# Patient Record
Sex: Female | Born: 1975 | Race: Black or African American | Hispanic: No | Marital: Single | Smoking: Never smoker
Health system: Southern US, Community
[De-identification: ages and names within clinical notes are randomized; demographics above are authoritative.]

## PROBLEM LIST (undated history)

## (undated) DIAGNOSIS — I10 Essential (primary) hypertension: Secondary | ICD-10-CM

---

## 2013-07-25 DIAGNOSIS — I10 Essential (primary) hypertension: Secondary | ICD-10-CM | POA: Insufficient documentation

## 2017-12-06 DIAGNOSIS — Z8742 Personal history of other diseases of the female genital tract: Secondary | ICD-10-CM | POA: Insufficient documentation

## 2018-04-24 DIAGNOSIS — Z9289 Personal history of other medical treatment: Secondary | ICD-10-CM | POA: Insufficient documentation

## 2018-04-25 DIAGNOSIS — G43009 Migraine without aura, not intractable, without status migrainosus: Secondary | ICD-10-CM | POA: Insufficient documentation

## 2018-11-22 DIAGNOSIS — M26609 Unspecified temporomandibular joint disorder, unspecified side: Secondary | ICD-10-CM | POA: Insufficient documentation

## 2020-07-25 ENCOUNTER — Emergency Department
Admission: EM | Admit: 2020-07-25 | Discharge: 2020-07-25 | Disposition: A | Discharge status: Home / Self Care | Payer: Self-pay | Attending: Emergency Medicine | Admitting: Emergency Medicine

## 2020-07-25 ENCOUNTER — Other Ambulatory Visit: Payer: Self-pay

## 2020-07-25 DIAGNOSIS — S61212A Laceration without foreign body of right middle finger without damage to nail, initial encounter: Secondary | ICD-10-CM | POA: Insufficient documentation

## 2020-07-25 DIAGNOSIS — Y92009 Unspecified place in unspecified non-institutional (private) residence as the place of occurrence of the external cause: Secondary | ICD-10-CM | POA: Insufficient documentation

## 2020-07-25 DIAGNOSIS — W274XXA Contact with kitchen utensil, initial encounter: Secondary | ICD-10-CM | POA: Insufficient documentation

## 2020-07-25 DIAGNOSIS — S61224A Laceration with foreign body of right ring finger without damage to nail, initial encounter: Secondary | ICD-10-CM | POA: Insufficient documentation

## 2020-07-25 DIAGNOSIS — S61214A Laceration without foreign body of right ring finger without damage to nail, initial encounter: Secondary | ICD-10-CM

## 2020-07-25 MED ORDER — LIDOCAINE HCL (PF) 1 % IJ SOLN
5.0000 mL | Freq: Once | INTRAMUSCULAR | Status: AC
  Administered 2020-07-25: 5 mL
  Filled 2020-07-25: qty 5

## 2020-07-25 MED ORDER — TETANUS-DIPHTH-ACELL PERTUSSIS 5-2.5-18.5 LF-MCG/0.5 IM SUSY: 0.5000 mL | PREFILLED_SYRINGE | Freq: Once | INTRAMUSCULAR | Status: DC

## 2020-07-25 NOTE — ED Provider Notes (Signed)
[facility] Emergency Department Provider Note ____________________________________________  Time seen: 1415  I have reviewed the triage vital signs and the nursing notes.  HISTORY  Chief Complaint  Laceration  HPI Suzanne Wallace is a 45 y.o. female instructed to the ED for evaluation of accidental laceration to the right middle and ring fingers.  Patient was at home, moving a metal toaster on the counter, when she accidentally sliced her fingers on a free edge on the bottom of the toaster.  She presents with bleeding currently controlled, but presented due to the inability to control bleeding to the 2 fingertip lacerations patient denies any nailbed injury or nail avulsion.  She also reports a current tetanus status.   History reviewed. No pertinent past medical history.  There are no problems to display for this patient.  History reviewed. No pertinent surgical history.  Prior to Admission medications   Not on File    Allergies Naproxen  History reviewed. No pertinent family history.  Social History    Review of Systems  Constitutional: Negative for fever. Cardiovascular: Negative for chest pain. Respiratory: Negative for shortness of breath. Gastrointestinal: Negative for abdominal pain, vomiting and diarrhea. Genitourinary: Negative for dysuria. Musculoskeletal: Negative for back pain. Skin: Negative for rash.  Right middle and ring finger lacerations as above. Neurological: Negative for headaches, focal weakness or numbness. ____________________________________________  PHYSICAL EXAM:  VITAL SIGNS: ED Triage Vitals  Enc Vitals Group     BP 07/25/20 1207 (!) 177/109     Pulse Rate 07/25/20 1207 92     Resp 07/25/20 1207 18     Temp 07/25/20 1207 98.2 F (36.8 C)     Temp Source 07/25/20 1207 Oral     SpO2 07/25/20 1207 100 %     Weight 07/25/20 1208 300 lb (136.1 kg)     Height 07/25/20 1208 5\' 11"  (1.803 m)     Head Circumference --       Peak Flow --      Pain Score 07/25/20 1208 9     Pain Loc --      Pain Edu? --      Excl. in GC? --     Constitutional: Alert and oriented. Well appearing and in no distress. Head: Normocephalic and atraumatic. Eyes: Conjunctivae are normal. Normal extraocular movements Cardiovascular: Normal rate, regular rhythm. Normal distal pulses. Respiratory: Normal respiratory effort.  Musculoskeletal: No composite fist on the right hand.  Right middle finger with a middle phalanx laceration with some exposure of the subcu fat.  The right middle finger reveals an oblique laceration across the DIP palmar crease.  Nontender with normal range of motion in all extremities.  Neurologic:  Normal sensation. Normal speech and language. No gross focal neurologic deficits are appreciated. Skin:  Skin is warm, dry and intact. No rash noted. ____________________________________________  PROCEDURES  .03/21/22Laceration Repair  Date/Time: 07/25/2020 3:11 PM Performed by: 07/27/2020, PA-C Authorized by: Lissa Hoard, PA-C   Consent:    Consent obtained:  Verbal   Consent given by:  Patient   Risks discussed:  Pain and poor wound healing Universal protocol:    Procedure explained and questions answered to patient or proxy's satisfaction: yes     Site/side marked: yes     Patient identity confirmed:  Verbally with patient Laceration details:    Location:  Finger   Finger location:  R long finger (& R ring finger)   Length (cm):  2 (&  1.5)   Depth (mm):  4 Pre-procedure details:    Preparation:  Patient was prepped and draped in usual sterile fashion Exploration:    Limited defect created (wound extended): no     Hemostasis achieved with:  Direct pressure Treatment:    Area cleansed with:  Povidone-iodine and saline   Amount of cleaning:  Standard   Irrigation solution:  Sterile saline   Irrigation method:  Syringe   Debridement:  None   Undermining:  None Skin repair:     Repair method:  Sutures   Suture size:  4-0   Suture material:  Nylon   Suture technique:  Simple interrupted   Number of sutures:  8 Approximation:    Approximation:  Close Repair type:    Repair type:  Simple Post-procedure details:    Dressing:  Non-adherent dressing   Procedure completion:  Tolerated well, no immediate complications   ____________________________________________  INITIAL IMPRESSION / ASSESSMENT AND PLAN / ED COURSE  Patient ED evaluation of accidental lacerations to the right middle and ring fingers.  She tolerated suture repair in the ED without difficulty.  She was discharged with wound care instructions and supplies.  Patient will follow up with her primary provider or local urgent care for suture removal in 10 to 12 days.   Suzanne Wallace was evaluated in Emergency Department on 07/25/2020 for the symptoms described in the history of present illness. She was evaluated in the context of the global COVID-19 pandemic, which necessitated consideration that the patient might be at risk for infection with the SARS-CoV-2 virus that causes COVID-19. Institutional protocols and algorithms that pertain to the evaluation of patients at risk for COVID-19 are in a state of rapid change based on information released by regulatory bodies including the CDC and federal and state organizations. These policies and algorithms were followed during the patient's care in the ED. ____________________________________________  FINAL CLINICAL IMPRESSION(S) / ED DIAGNOSES  Final diagnoses:  Laceration of right middle finger without foreign body without damage to nail, initial encounter  Laceration of right ring finger without foreign body without damage to nail, initial encounter      Lissa Hoard, PA-C 07/25/20 1544    Chesley Noon, MD 07/26/20 1023

## 2020-07-25 NOTE — Discharge Instructions (Signed)
Keep the wounds clean, dry, and covered.  See your provider or local urgent care for suture removal in 10 to 12 days.

## 2020-07-25 NOTE — ED Triage Notes (Signed)
Pt picked up toaster and cut right index and middle fingers. Pt states she does not take blood thinners but bleeding would not stop and fire department had to be called. Dressings left in place at present due to difficulty stopping bleeding earlier. Pt states she had panic attack when bleeding would not stop. Pt in NAD at present, bandages in place, bleeding controlled at present.

## 2020-08-12 ENCOUNTER — Ambulatory Visit
Admission: EM | Admit: 2020-08-12 | Discharge: 2020-08-12 | Disposition: A | Discharge status: Home / Self Care | Payer: Self-pay

## 2020-08-12 ENCOUNTER — Other Ambulatory Visit: Payer: Self-pay

## 2020-08-12 NOTE — ED Triage Notes (Signed)
Patient here for suture removal to her right rimg finger and middle finger. 3 sutures removed from middle finger and 5 sutures removed from ring finger. Patient tolerated well.

## 2020-10-31 ENCOUNTER — Ambulatory Visit (INDEPENDENT_AMBULATORY_CARE_PROVIDER_SITE_OTHER): Payer: BC Managed Care – PPO

## 2020-10-31 ENCOUNTER — Encounter: Payer: Self-pay | Admitting: Emergency Medicine

## 2020-10-31 ENCOUNTER — Other Ambulatory Visit: Payer: Self-pay

## 2020-10-31 ENCOUNTER — Ambulatory Visit
Admission: EM | Admit: 2020-10-31 | Discharge: 2020-10-31 | Disposition: A | Discharge status: Home / Self Care | Payer: BC Managed Care – PPO

## 2020-10-31 DIAGNOSIS — M25572 Pain in left ankle and joints of left foot: Secondary | ICD-10-CM | POA: Diagnosis not present

## 2020-10-31 DIAGNOSIS — S93402A Sprain of unspecified ligament of left ankle, initial encounter: Secondary | ICD-10-CM | POA: Diagnosis not present

## 2020-10-31 NOTE — Discharge Instructions (Signed)
SPRAIN: Stressed avoiding painful activities . Reviewed RICE guidelines. Use medications as directed, including NSAIDs. If no NSAIDs have been prescribed for you today, you may take Aleve or Motrin over the counter. May use Tylenol in between doses of NSAIDs.  If no improvement in the next 1-2 weeks, f/u with PCP or return to our office for reexamination, and please feel free to call or return at any time for any questions or concerns you may have and we will be happy to help you!     

## 2020-10-31 NOTE — ED Provider Notes (Signed)
[facility]    CSN: [id number] Arrival date & time: 10/31/20  1343      History   Chief Complaint Chief Complaint  Patient presents with   Ankle Pain    left    HPI Suzanne Wallace is a 45 y.o. female presenting for approximately 10-day history of left ankle pain.  Patient says that 10 days ago she twisted her ankle and has had persistent swelling and pain of the inner ankle since.  She says that she has been working more than full-time recently and has had to be on her feet a lot.  She says that on her days off her pain improves a lot.  She says that she is taking Tylenol and applied a soft brace but those methods do not seem to be helping.  She says that she also has occasionally applied ice.  She denies any numbness.  She does admit to occasional weakness upon standing and feels like she could fall.  She says that she has had sprains of this ankle in the past.  Patient concerned about fracture or sprain at this time.  No other complaints or injuries.  HPI  History reviewed. No pertinent past medical history.  There are no problems to display for this patient.   History reviewed. No pertinent surgical history.  OB History   No obstetric history on file.      Home Medications    Prior to Admission medications   Medication Sig Start Date End Date Taking? Authorizing Provider  cetirizine (ZYRTEC) 10 MG tablet Take 1 tablet by mouth daily. 10/25/18  Yes [provider]    Family History No family history on file.  Social History Social History   Tobacco Use   Smoking status: Never   Smokeless tobacco: Never  Vaping Use   Vaping Use: Never used  Substance Use Topics   Alcohol use: Not Currently   Drug use: Not Currently     Allergies   Naproxen   Review of Systems Review of Systems  Musculoskeletal:  Positive for arthralgias, gait problem and joint swelling.  Skin:  Negative for color change and wound.  Neurological:  Negative for  weakness and numbness.    Physical Exam Triage Vital Signs ED Triage Vitals  Enc Vitals Group     BP 10/31/20 1409 139/78     Pulse Rate 10/31/20 1409 73     Resp 10/31/20 1409 18     Temp 10/31/20 1409 98.8 F (37.1 C)     Temp Source 10/31/20 1409 Oral     SpO2 10/31/20 1409 98 %     Weight 10/31/20 1407 (!) 300 lb 0.7 oz (136.1 kg)     Height 10/31/20 1407 5\' 11"  (1.803 m)     Head Circumference --      Peak Flow --      Pain Score 10/31/20 1406 9     Pain Loc --      Pain Edu? --      Excl. in GC? --    No data found.  Updated Vital Signs BP 139/78 (BP Location: Left Arm)   Pulse 73   Temp 98.8 F (37.1 C) (Oral)   Resp 18   Ht 5\' 11"  (1.803 m)   Wt (!) 300 lb 0.7 oz (136.1 kg)   SpO2 98%   BMI 41.85 kg/m      Physical Exam Vitals and nursing note reviewed.  Constitutional:      General:  She is not in acute distress.    Appearance: Normal appearance. She is not ill-appearing or toxic-appearing.  HENT:     Head: Normocephalic and atraumatic.  Eyes:     General: No scleral icterus.       Right eye: No discharge.        Left eye: No discharge.     Conjunctiva/sclera: Conjunctivae normal.  Cardiovascular:     Rate and Rhythm: Normal rate and regular rhythm.     Pulses: Normal pulses.  Pulmonary:     Effort: Pulmonary effort is normal. No respiratory distress.  Musculoskeletal:     Cervical back: Neck supple.     Left ankle: Swelling (moderate swelling medial ankle) present. No ecchymosis. Tenderness present over the medial malleolus (and diffusely of medial ankle and hindfoot). Decreased range of motion. Normal pulse.  Skin:    General: Skin is dry.  Neurological:     General: No focal deficit present.     Mental Status: She is alert. Mental status is at baseline.     Motor: No weakness.     Gait: Gait abnormal.  Psychiatric:        Mood and Affect: Mood normal.        Behavior: Behavior normal.        Thought Content: Thought content normal.      UC Treatments / Results  Labs (all labs ordered are listed, but only abnormal results are displayed) Labs Reviewed - No data to display  EKG   Radiology DG Ankle Complete Left  Result Date: 10/31/2020 CLINICAL DATA:  Left ankle pain and swelling after injury a week ago. EXAM: LEFT ANKLE COMPLETE - 3+ VIEW COMPARISON:  None. FINDINGS: No acute fracture or dislocation. No tibiotalar joint effusion. The ankle mortise is symmetric. The talar dome is intact. Dorsal talonavicular degenerative spurring. Soft tissues are unremarkable. IMPRESSION: No acute osseous abnormality. Electronically Signed   By: Obie Dredge M.D.   On: 10/31/2020 14:32    Procedures Procedures (including critical care time)  Medications Ordered in UC Medications - No data to display  Initial Impression / Assessment and Plan / UC Course  I have reviewed the triage vital signs and the nursing notes.  Pertinent labs & imaging results that were available during my care of the patient were reviewed by me and considered in my medical decision making (see chart for details).  45 year old female presenting for left ankle pain for the past week and a half.  Admits to twisting injury 10 days ago.  X-ray of ankle obtained today and independently reviewed by me.  X-rays negative for any fractures but does note dorsal talonavicular degenerative spurring.  Reviewed this with patient.  Advised her she has an ankle sprain.  Supportive care encouraged with RICE and switching from Tylenol to ibuprofen.  Patient placed in ankle brace.  Patient given a work note for a few days so that she can rest.  Advised to follow-up with Ortho if still not improving over the next 1 to 2 weeks.   Final Clinical Impressions(s) / UC Diagnoses   Final diagnoses:  Sprain of left ankle, unspecified ligament, initial encounter  Acute left ankle pain     Discharge Instructions      SPRAIN: Stressed avoiding painful activities . Reviewed  RICE guidelines. Use medications as directed, including NSAIDs. If no NSAIDs have been prescribed for you today, you may take Aleve or Motrin over the counter. May use Tylenol in between doses of  NSAIDs.  If no improvement in the next 1-2 weeks, f/u with PCP or return to our office for reexamination, and please feel free to call or return at any time for any questions or concerns you may have and we will be happy to help you!         ED Prescriptions   None    PDMP not reviewed this encounter.   Shirlee Latch, PA-C 10/31/20 1511

## 2020-10-31 NOTE — ED Triage Notes (Signed)
Pt c/o left ankle pain and swelling. Started about about a week ago. She states she turned over her ankle. She has tried ice, tylenol, and topical muscle rub.

## 2021-05-19 ENCOUNTER — Encounter: Payer: Self-pay | Admitting: Emergency Medicine

## 2021-05-19 ENCOUNTER — Other Ambulatory Visit: Payer: Self-pay

## 2021-05-19 ENCOUNTER — Ambulatory Visit
Admission: EM | Admit: 2021-05-19 | Discharge: 2021-05-19 | Disposition: A | Discharge status: Home / Self Care | Payer: BC Managed Care – PPO | Attending: Emergency Medicine | Admitting: Emergency Medicine

## 2021-05-19 DIAGNOSIS — R197 Diarrhea, unspecified: Secondary | ICD-10-CM | POA: Insufficient documentation

## 2021-05-19 DIAGNOSIS — R112 Nausea with vomiting, unspecified: Secondary | ICD-10-CM | POA: Insufficient documentation

## 2021-05-19 DIAGNOSIS — B349 Viral infection, unspecified: Secondary | ICD-10-CM | POA: Insufficient documentation

## 2021-05-19 HISTORY — DX: Essential (primary) hypertension: I10

## 2021-05-19 LAB — RAPID INFLUENZA A&B ANTIGENS
Influenza A (ARMC): NEGATIVE
Influenza B (ARMC): NEGATIVE

## 2021-05-19 MED ORDER — PREDNISONE 10 MG (21) PO TBPK: ORAL_TABLET | Freq: Every day | ORAL | 0 refills | Status: DC

## 2021-05-19 MED ORDER — ONDANSETRON HCL 4 MG PO TABS: 4.0000 mg | ORAL_TABLET | Freq: Four times a day (QID) | ORAL | 0 refills | Status: DC

## 2021-05-19 NOTE — Discharge Instructions (Addendum)
Take Tylenol and Motrin as needed for pain or fever Take nausea medicine as needed It is a viral illness no antibiotics are needed at this time Continue to take over-the-counter cough and cold Coricidin medicines Can take Tylenol or Motrin as needed for fever or pain The symptoms are more viral in nature Your flu test was negative The symptoms may take several weeks to resolve Stay hydrated drink plenty of fluids

## 2021-05-19 NOTE — ED Provider Notes (Signed)
[facility]    CSN: [id number] Arrival date & time: 05/19/21  1006      History   Chief Complaint Chief Complaint  Patient presents with   Emesis   Diarrhea   Cough    HPI Suzanne Wallace is a 46 y.o. female.   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  Has taken several COVID test and these were negative.  Is taking Coricidin over-the-counter with very minimal relief relief.  She states that her grandson was at the home and was ill with same symptoms.   Past Medical History:  Diagnosis Date   Hypertension     There are no problems to display for this patient.   History reviewed. No pertinent surgical history.  OB History   No obstetric history on file.      Home Medications    Prior to Admission medications   Medication Sig Start Date End Date Taking? Authorizing Provider  cetirizine (ZYRTEC) 10 MG tablet Take 1 tablet by mouth daily. 10/25/18  Yes [provider]  lisinopril (ZESTRIL) 10 MG tablet Take 10 mg by mouth daily. 03/13/21  Yes [provider]  ondansetron (ZOFRAN) 4 MG tablet Take 1 tablet (4 mg total) by mouth every 6 (six) hours. 05/19/21  Yes Coralyn Mark, NP  predniSONE (STERAPRED UNI-PAK 21 TAB) 10 MG (21) TBPK tablet Take by mouth daily. Take 6 tabs by mouth daily  for 2 days, then 5 tabs for 2 days, then 4 tabs for 2 days, then 3 tabs for 2 days, 2 tabs for 2 days, then 1 tab by mouth daily for 2 days 05/19/21  Yes Coralyn Mark, NP    Family History No family history on file.  Social History Social History   Tobacco Use   Smoking status: Never   Smokeless tobacco: Never  Vaping Use   Vaping Use: Never used  Substance Use Topics   Alcohol use: Not Currently   Drug use: Not Currently     Allergies   Naproxen, Amlodipine, Hydralazine hcl, Lidocaine, and Venlafaxine   Review of Systems Review of Systems   Constitutional:  Positive for activity change and fever. Negative for chills.  HENT:  Positive for congestion and sore throat.   Respiratory:  Positive for cough. Negative for shortness of breath.   Cardiovascular: Negative.   Gastrointestinal:  Positive for diarrhea, nausea and vomiting. Negative for abdominal pain.  Genitourinary: Negative.   Skin: Negative.   Neurological:  Positive for weakness.    Physical Exam Triage Vital Signs ED Triage Vitals  Enc Vitals Group     BP 05/19/21 1022 (!) 152/89     Pulse Rate 05/19/21 1022 72     Resp 05/19/21 1022 18     Temp 05/19/21 1022 98.4 F (36.9 C)     Temp Source 05/19/21 1022 Oral     SpO2 05/19/21 1022 100 %     Weight 05/19/21 1019 (!) 300 lb 0.7 oz (136.1 kg)     Height 05/19/21 1019 5\' 11"  (1.803 m)     Head Circumference --      Peak Flow --      Pain Score 05/19/21 1018 0     Pain Loc --      Pain Edu? --      Excl. in GC? --    No data found.  Updated Vital Signs  BP (!) 152/89 (BP Location: Left Arm)    Pulse 72    Temp 98.4 F (36.9 C) (Oral)    Resp 18    Ht 5\' 11"  (1.803 m)    Wt (!) 300 lb 0.7 oz (136.1 kg)    LMP 05/15/2021 (Approximate)    SpO2 100%    BMI 41.85 kg/m   Visual Acuity Right Eye Distance:   Left Eye Distance:   Bilateral Distance:    Right Eye Near:   Left Eye Near:    Bilateral Near:     Physical Exam Constitutional:      Appearance: Normal appearance. She is obese.  HENT:     Right Ear: Tympanic membrane normal.     Left Ear: Tympanic membrane normal.     Nose: Congestion present.     Mouth/Throat:     Mouth: Mucous membranes are moist.     Pharynx: Posterior oropharyngeal erythema present.  Eyes:     Pupils: Pupils are equal, round, and reactive to light.  Cardiovascular:     Rate and Rhythm: Normal rate.  Pulmonary:     Effort: Pulmonary effort is normal.  Abdominal:     General: Abdomen is flat.  Musculoskeletal:        General: Normal range of motion.  Skin:     General: Skin is warm.  Neurological:     General: No focal deficit present.     Mental Status: She is alert.     UC Treatments / Results  Labs (all labs ordered are listed, but only abnormal results are displayed) Labs Reviewed  RAPID INFLUENZA A&B ANTIGENS    EKG   Radiology No results found.  Procedures Procedures (including critical care time)  Medications Ordered in UC Medications - No data to display  Initial Impression / Assessment and Plan / UC Course  I have reviewed the triage vital signs and the nursing notes.  Pertinent labs & imaging results that were available during my care of the patient were reviewed by me and considered in my medical decision making (see chart for details).     Continue to take over-the-counter cough and cold Coricidin medicines Can take Tylenol or Motrin as needed for fever or pain The symptoms are more viral in nature Your flu test was negative The symptoms may take several weeks to resolve Stay hydrated drink plenty of fluids Final Clinical Impressions(s) / UC Diagnoses   Final diagnoses:  Nausea vomiting and diarrhea  Viral illness     Discharge Instructions      Take Tylenol and Motrin as needed for pain or fever Take nausea medicine as needed It is a viral illness no antibiotics are needed at this time Continue to take over-the-counter cough and cold Coricidin medicines Can take Tylenol or Motrin as needed for fever or pain The symptoms are more viral in nature Your flu test was negative The symptoms may take several weeks to resolve Stay hydrated drink plenty of fluids    ED Prescriptions     Medication Sig Dispense Auth. Provider   predniSONE (STERAPRED UNI-PAK 21 TAB) 10 MG (21) TBPK tablet Take by mouth daily. Take 6 tabs by mouth daily  for 2 days, then 5 tabs for 2 days, then 4 tabs for 2 days, then 3 tabs for 2 days, 2 tabs for 2 days, then 1 tab by mouth daily for 2 days 42 tablet 07/13/2021 L, NP    ondansetron (ZOFRAN) 4 MG tablet Take  1 tablet (4 mg total) by mouth every 6 (six) hours. 12 tablet Coralyn MarkMitchell, Elonna Mcfarlane L, NP      PDMP not reviewed this encounter.   Coralyn MarkMitchell, Sonnet Rizor L, NP 05/19/21 1118

## 2021-05-19 NOTE — ED Triage Notes (Signed)
Pt c/o vomiting, diarrhea, nasal congestion, sinus pain/pressure, hoarseness, weakness, and cough.The diarrhea and  vomiting have resolved. Started about 5 days ago. She has had multiple covid test at home and work and were negative.

## 2022-04-14 IMAGING — CR DG ANKLE COMPLETE 3+V*L*
3 series · 3 of 3 positions shown · non-contrast
Comparison: None.

CLINICAL DATA: Left ankle pain and swelling after injury a week
ago.

EXAM:
LEFT ANKLE COMPLETE - 3+ VIEW

[ankle ap]
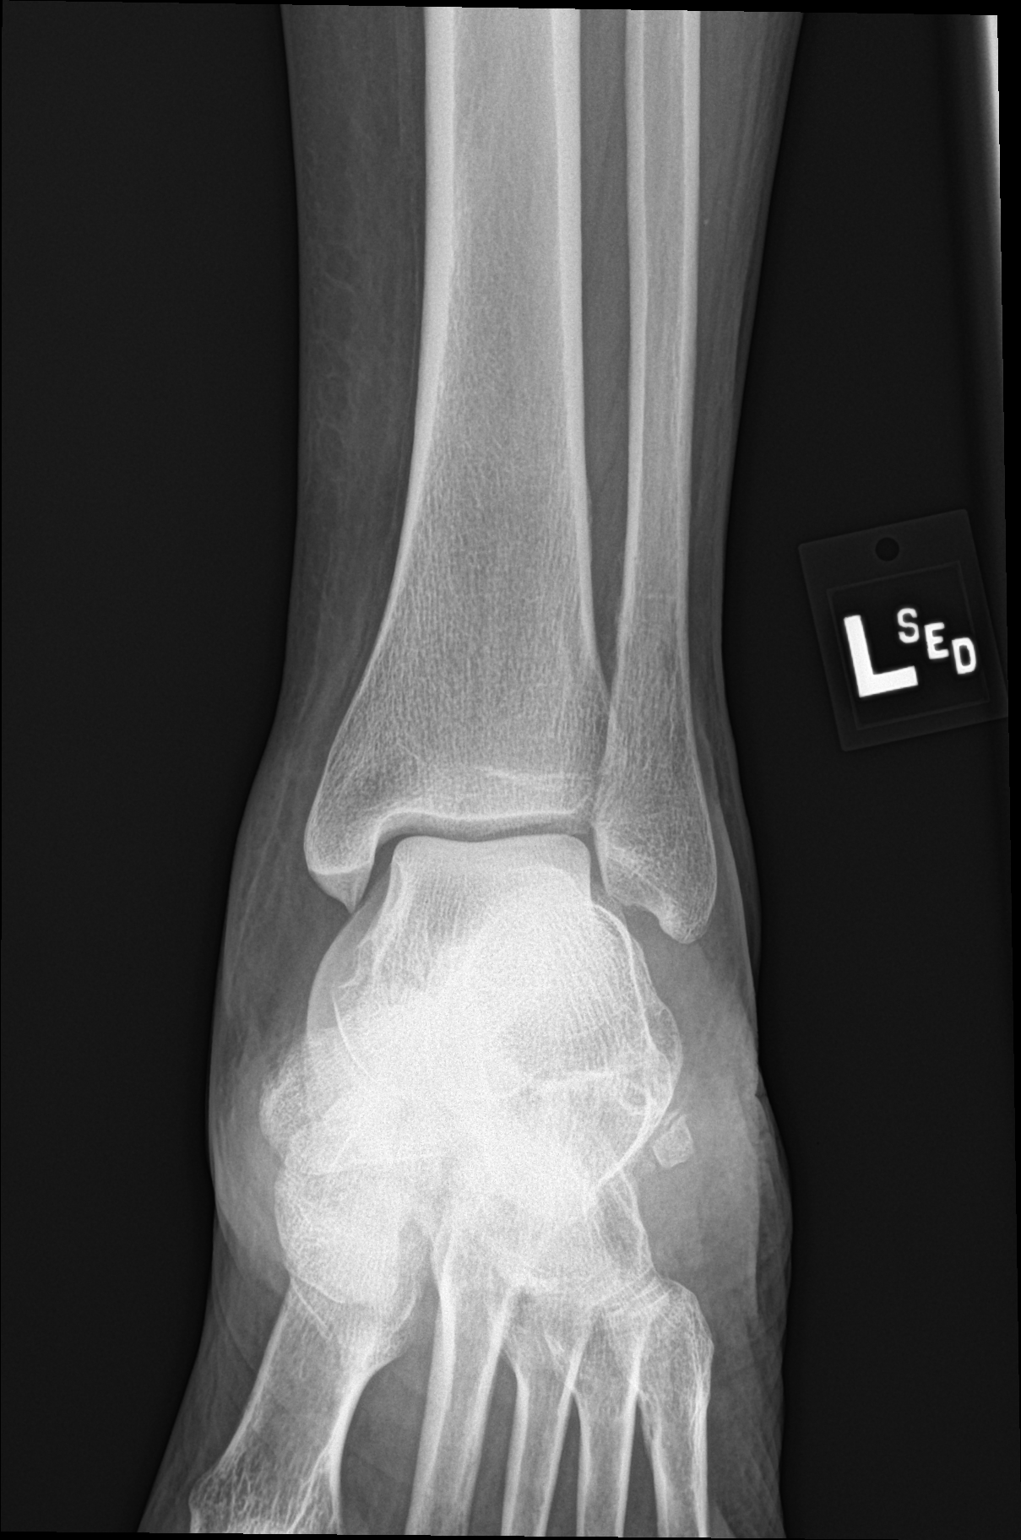

[ankle obl]
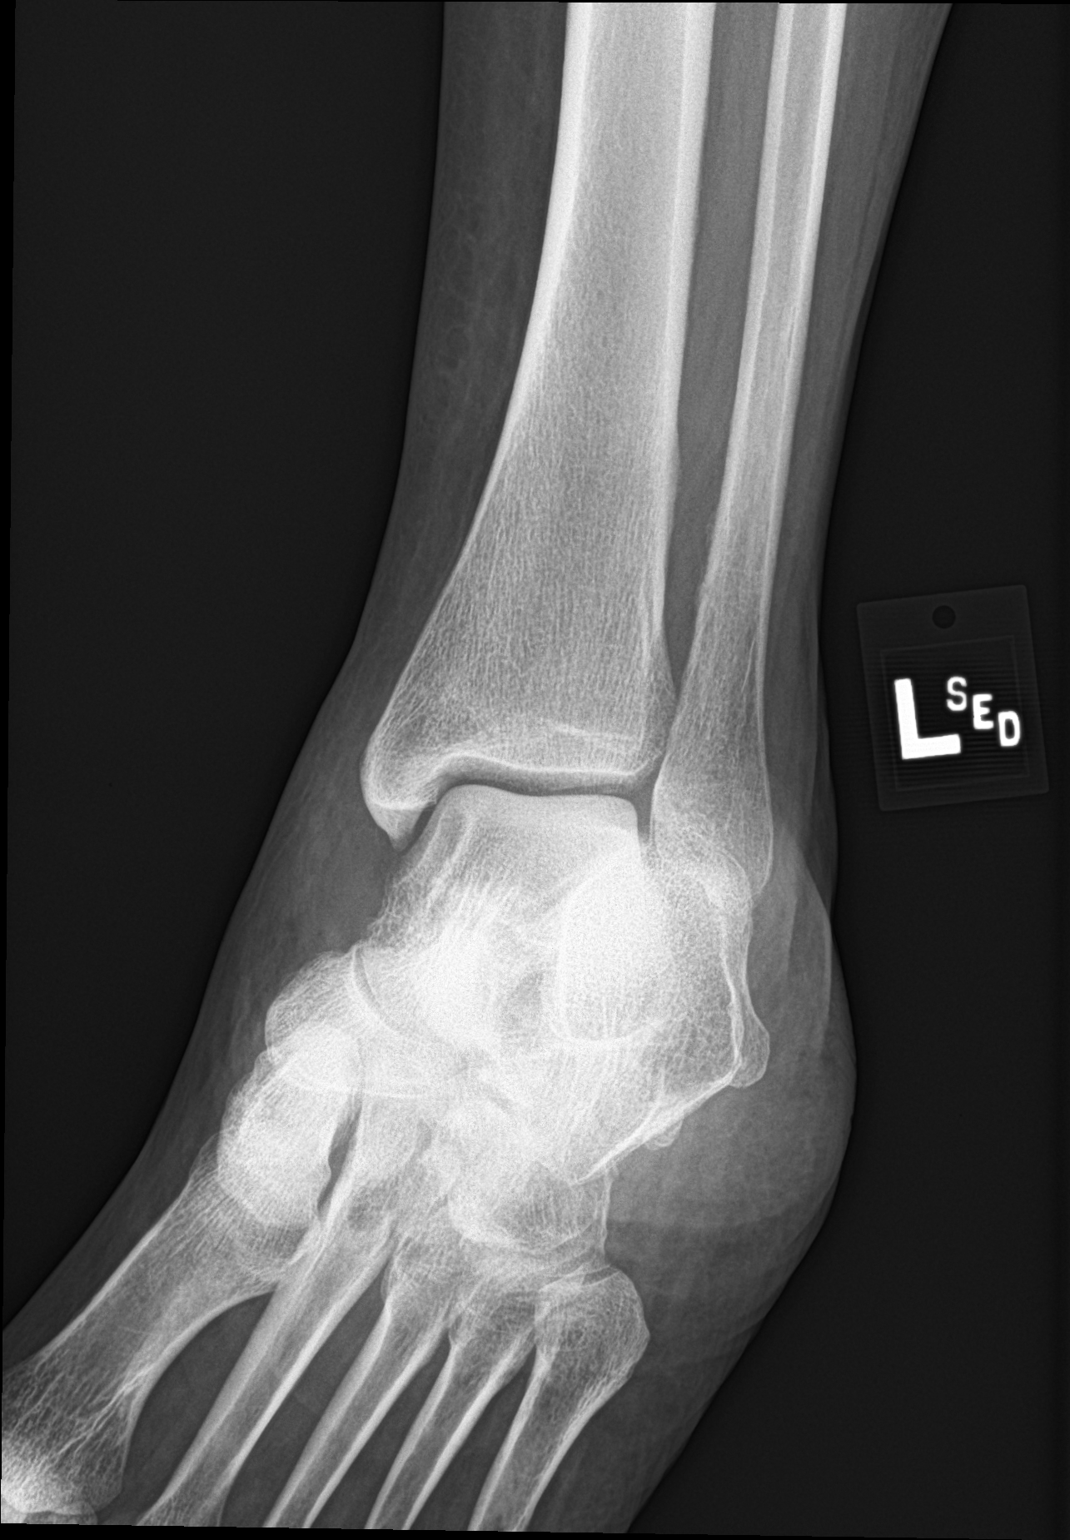

[ankle lat]
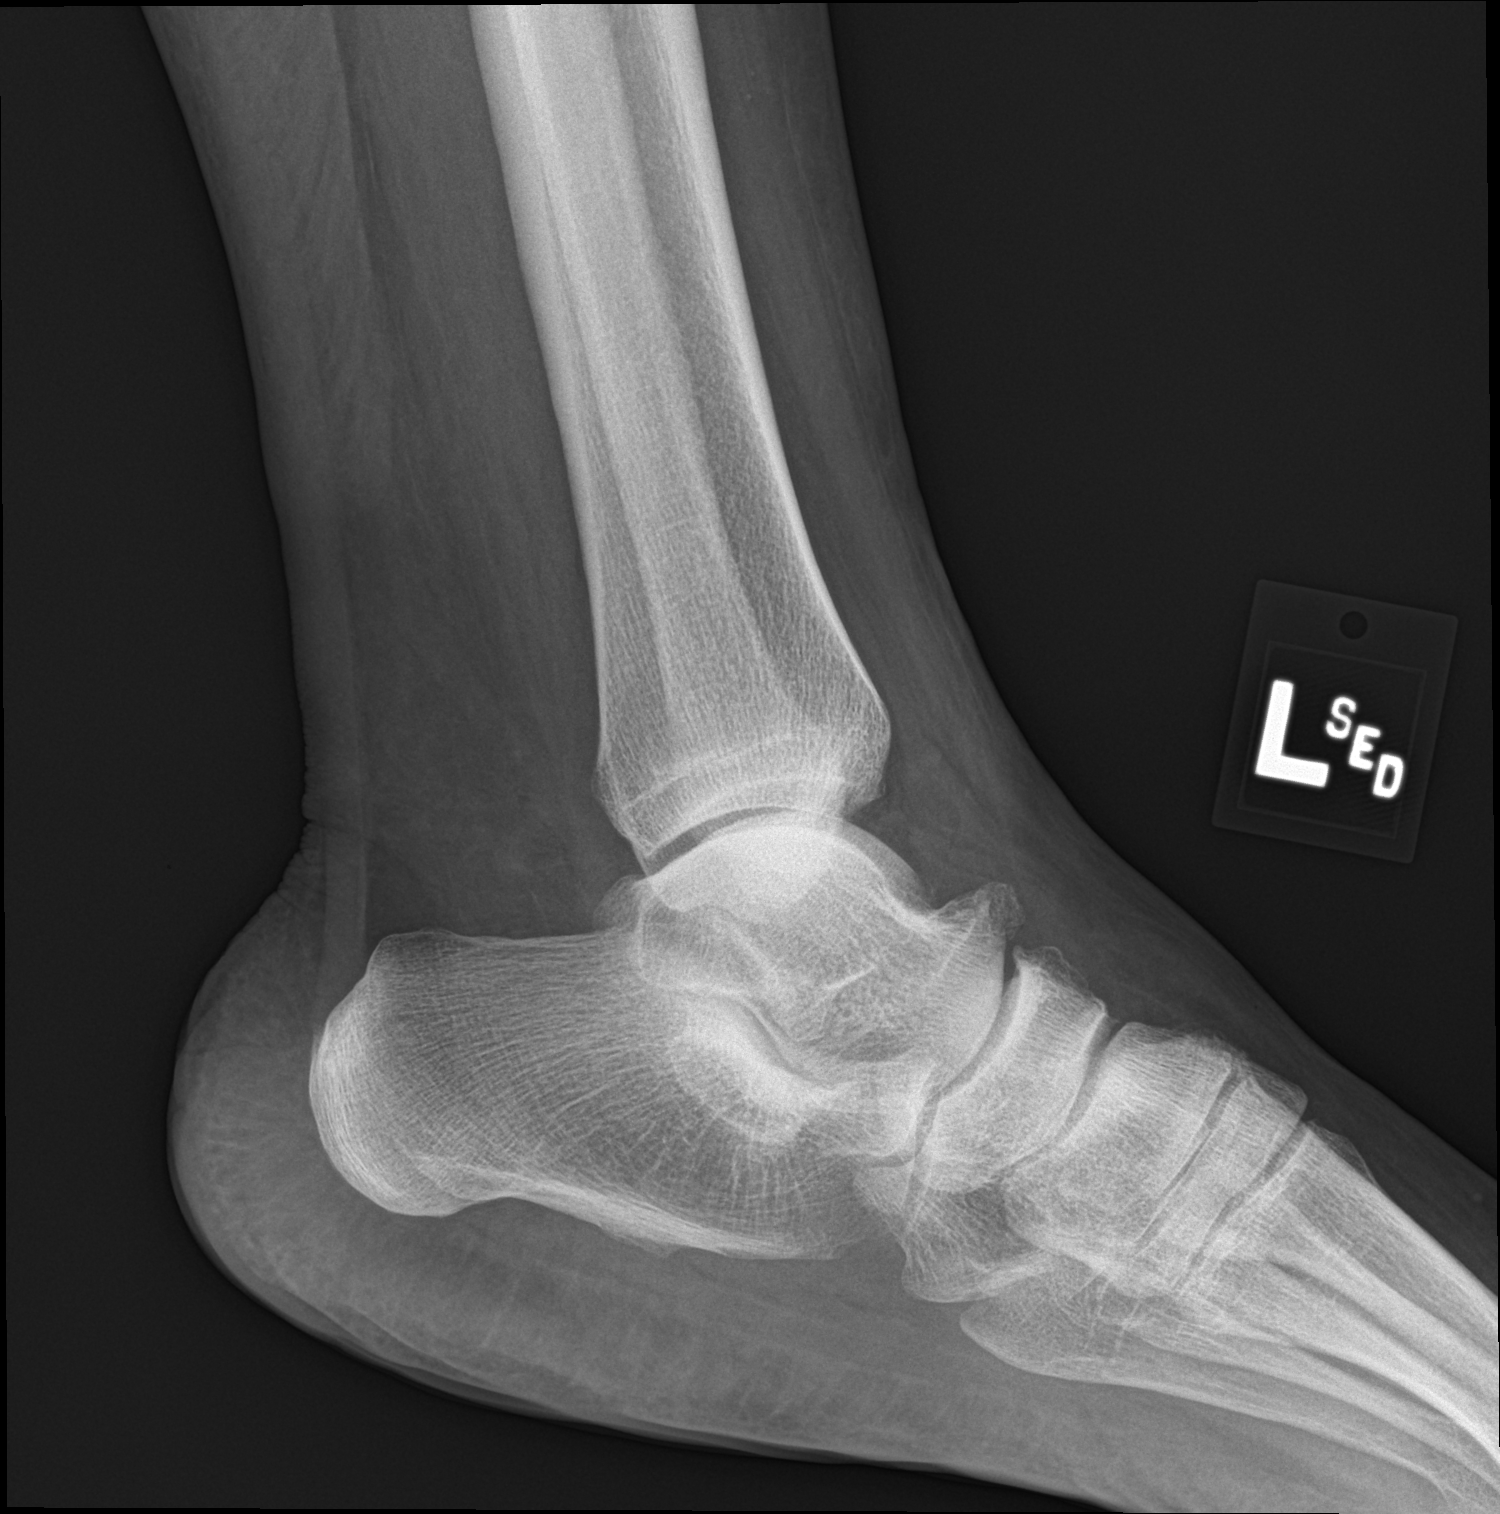

[3 of 3 positions shown; findings below may reference images not displayed]

FINDINGS: No acute fracture or dislocation. No tibiotalar joint effusion. The
ankle mortise is symmetric. The talar dome is intact. Dorsal
talonavicular degenerative spurring. Soft tissues are unremarkable.
IMPRESSION: No acute osseous abnormality.

## 2023-03-07 ENCOUNTER — Ambulatory Visit
Admission: EM | Admit: 2023-03-07 | Discharge: 2023-03-07 | Disposition: A | Discharge status: Home / Self Care | Payer: Managed Care, Other (non HMO) | Attending: Emergency Medicine | Admitting: Emergency Medicine

## 2023-03-07 ENCOUNTER — Encounter: Payer: Self-pay | Admitting: Emergency Medicine

## 2023-03-07 DIAGNOSIS — I10 Essential (primary) hypertension: Secondary | ICD-10-CM

## 2023-03-07 DIAGNOSIS — Z1152 Encounter for screening for COVID-19: Secondary | ICD-10-CM | POA: Diagnosis not present

## 2023-03-07 DIAGNOSIS — R051 Acute cough: Secondary | ICD-10-CM | POA: Diagnosis not present

## 2023-03-07 DIAGNOSIS — J3489 Other specified disorders of nose and nasal sinuses: Secondary | ICD-10-CM

## 2023-03-07 MED ORDER — BENZONATATE 100 MG PO CAPS: 100.0000 mg | ORAL_CAPSULE | Freq: Three times a day (TID) | ORAL | 0 refills | Status: DC

## 2023-03-07 NOTE — ED Provider Notes (Signed)
[facility]    CSN: [id number] Arrival date & time: 03/07/23  4098      History   Chief Complaint Chief Complaint  Patient presents with   Sinus Pain    Headache   Cough    HPI Suzanne Wallace is a 47 y.o. female.   47 year old female, Suzanne Wallace, presents to urgent care for evaluation of sinus pain, headache, cough, SOB x 2 days. Tried otc meds and tylenol for symptom management.   She states her downstairs neighbor was smoking weed and she was overcome by the fumes causing symptoms, notified her landlord.  The history is provided by the patient. No language interpreter was used.    Past Medical History:  Diagnosis Date   Hypertension     Patient Active Problem List   Diagnosis Date Noted   Elevated blood pressure reading in office with diagnosis of hypertension 03/07/2023   Pain of maxillary sinus 03/07/2023   Acute cough 03/07/2023    History reviewed. No pertinent surgical history.  OB History   No obstetric history on file.      Home Medications    Prior to Admission medications   Medication Sig Start Date End Date Taking? Authorizing Provider  benzonatate (TESSALON) 100 MG capsule Take 1 capsule (100 mg total) by mouth every 8 (eight) hours. 03/07/23  Yes Misha Antonini, Para March, NP  cetirizine (ZYRTEC) 10 MG tablet Take 1 tablet by mouth daily. 10/25/18  Yes [provider]  lisinopril (ZESTRIL) 10 MG tablet Take 10 mg by mouth daily. 03/13/21   [provider]  ondansetron (ZOFRAN) 4 MG tablet Take 1 tablet (4 mg total) by mouth every 6 (six) hours. 05/19/21   Coralyn Mark, NP  predniSONE (STERAPRED UNI-PAK 21 TAB) 10 MG (21) TBPK tablet Take by mouth daily. Take 6 tabs by mouth daily  for 2 days, then 5 tabs for 2 days, then 4 tabs for 2 days, then 3 tabs for 2 days, 2 tabs for 2 days, then 1 tab by mouth daily for 2 days 05/19/21   Coralyn Mark, NP    Family History No family history on file.  Social  History Social History   Tobacco Use   Smoking status: Never   Smokeless tobacco: Never  Vaping Use   Vaping status: Never Used  Substance Use Topics   Alcohol use: Not Currently   Drug use: Not Currently     Allergies   Naproxen, Amlodipine, Hydralazine hcl, Lidocaine, and Venlafaxine   Review of Systems Review of Systems  Constitutional:  Negative for fever.  HENT:  Positive for sinus pressure and sinus pain.   Respiratory:  Positive for cough and shortness of breath. Negative for wheezing and stridor.   Cardiovascular:  Negative for palpitations.  Gastrointestinal:  Negative for nausea and vomiting.  All other systems reviewed and are negative.    Physical Exam Triage Vital Signs ED Triage Vitals  Encounter Vitals Group     BP      Systolic BP Percentile      Diastolic BP Percentile      Pulse      Resp      Temp      Temp src      SpO2      Weight      Height      Head Circumference      Peak Flow      Pain Score      Pain  Loc      Pain Education      Exclude from Growth Chart    No data found.  Updated Vital Signs BP (!) 160/104   Pulse 93   Temp 98.2 F (36.8 C) (Oral)   Resp 18   SpO2 97%   Visual Acuity Right Eye Distance:   Left Eye Distance:   Bilateral Distance:    Right Eye Near:   Left Eye Near:    Bilateral Near:     Physical Exam Vitals and nursing note reviewed.  Constitutional:      General: She is not in acute distress.    Appearance: She is well-developed and well-groomed. She is obese.  HENT:     Head: Normocephalic and atraumatic.     Right Ear: Tympanic membrane is retracted.     Left Ear: Tympanic membrane is retracted.     Nose: Mucosal edema and congestion present.     Right Sinus: Maxillary sinus tenderness present.     Left Sinus: Maxillary sinus tenderness present.     Comments: -pnd    Mouth/Throat:     Lips: Pink.     Mouth: Mucous membranes are moist.     Pharynx: Uvula midline. Posterior oropharyngeal  erythema present.     Tonsils: No tonsillar exudate or tonsillar abscesses.  Eyes:     Conjunctiva/sclera: Conjunctivae normal.  Cardiovascular:     Rate and Rhythm: Normal rate and regular rhythm.     Pulses: Normal pulses.     Heart sounds: Normal heart sounds. No murmur heard. Pulmonary:     Effort: Pulmonary effort is normal. No respiratory distress.     Breath sounds: Normal breath sounds and air entry.     Comments: Sat 97% on room air Abdominal:     Palpations: Abdomen is soft.     Tenderness: There is no abdominal tenderness.  Musculoskeletal:        General: No swelling.     Cervical back: Neck supple.  Skin:    General: Skin is warm and dry.     Capillary Refill: Capillary refill takes less than 2 seconds.  Neurological:     General: No focal deficit present.     Mental Status: She is alert and oriented to person, place, and time.     GCS: GCS eye subscore is 4. GCS verbal subscore is 5. GCS motor subscore is 6.     Cranial Nerves: No cranial nerve deficit.     Sensory: No sensory deficit.  Psychiatric:        Attention and Perception: Attention normal.        Mood and Affect: Mood normal.        Speech: Speech normal.        Behavior: Behavior normal. Behavior is cooperative.      UC Treatments / Results  Labs (all labs ordered are listed, but only abnormal results are displayed) Labs Reviewed  SARS CORONAVIRUS 2 (TAT 6-24 HRS)    EKG   Radiology No results found.  Procedures Procedures (including critical care time)  Medications Ordered in UC Medications - No data to display  Initial Impression / Assessment and Plan / UC Course  I have reviewed the triage vital signs and the nursing notes.  Pertinent labs & imaging results that were available during my care of the patient were reviewed by me and considered in my medical decision making (see chart for details).    Discussed exam findings and plan  of care with patient, strict go to ER precautions  given.  Patient requesting cough medicine, Tessalon prescribed, no indication for antibiotics at this time, patient verbalized understanding to this provider.  Ddx: Maxillary sinus pain, secondhand smoke exposure, allergies, acute cough, viral illness, elevated blood pressure reading Final Clinical Impressions(s) / UC Diagnoses   Final diagnoses:  Elevated blood pressure reading in office with diagnosis of hypertension  Pain of maxillary sinus  Acute cough     Discharge Instructions      No antibiotic as indicated at this time, check my chart for results. May treat with OTC meds of choice(tylenol, flonase nasal spray, allergy med of choice, delsym as label directed, honey,hot tea, tessalon perles as directed, etc). Make sure to drink plenty of fluids to stay hydrated(gatorade, water, popsicles,jello,etc), avoid caffeine products. Follow up with PCP. Return as needed.     ED Prescriptions     Medication Sig Dispense Auth. Provider   benzonatate (TESSALON) 100 MG capsule Take 1 capsule (100 mg total) by mouth every 8 (eight) hours. 21 capsule Arihanna Estabrook, Para March, NP      PDMP not reviewed this encounter.   Clancy Gourd, NP 03/07/23 2125

## 2023-03-07 NOTE — Discharge Instructions (Addendum)
No antibiotic as indicated at this time, check my chart for results. May treat with OTC meds of choice(tylenol, flonase nasal spray, allergy med of choice, delsym as label directed, honey,hot tea, tessalon perles as directed, etc). Make sure to drink plenty of fluids to stay hydrated(gatorade, water, popsicles,jello,etc), avoid caffeine products. Follow up with PCP. Return as needed.

## 2023-03-07 NOTE — ED Triage Notes (Signed)
Pt c/o sinus pain, headache, SOB and cough x 2 days. Pt has tried OTC allergy medication and tylenol for her symptoms.

## 2023-03-24 ENCOUNTER — Encounter: Payer: Self-pay | Admitting: Emergency Medicine

## 2023-03-24 ENCOUNTER — Ambulatory Visit
Admission: EM | Admit: 2023-03-24 | Discharge: 2023-03-24 | Disposition: A | Discharge status: Home / Self Care | Payer: Managed Care, Other (non HMO) | Attending: Emergency Medicine | Admitting: Emergency Medicine

## 2023-03-24 DIAGNOSIS — I10 Essential (primary) hypertension: Secondary | ICD-10-CM | POA: Insufficient documentation

## 2023-03-24 DIAGNOSIS — R0789 Other chest pain: Secondary | ICD-10-CM | POA: Diagnosis present

## 2023-03-24 DIAGNOSIS — E871 Hypo-osmolality and hyponatremia: Secondary | ICD-10-CM

## 2023-03-24 LAB — BASIC METABOLIC PANEL
Anion gap: 9 (ref 5–15)
BUN: 20 mg/dL (ref 6–20)
CO2: 24 mmol/L (ref 22–32)
Calcium: 9.4 mg/dL (ref 8.9–10.3)
Chloride: 100 mmol/L (ref 98–111)
Creatinine, Ser: 0.84 mg/dL (ref 0.44–1.00)
GFR, Estimated: >60 mL/min (ref >60)
Glucose, Bld: 99 mg/dL (ref 70–99)
Potassium: 4 mmol/L (ref 3.5–5.1)
Sodium: 133 mmol/L — ABNORMAL LOW (ref 135–145)

## 2023-03-24 MED ORDER — ACETAMINOPHEN 325 MG PO TABS
975.0000 mg | ORAL_TABLET | Freq: Once | ORAL | Status: AC
  Administered 2023-03-24: 975 mg via ORAL

## 2023-03-24 MED ORDER — OLMESARTAN MEDOXOMIL 20 MG PO TABS: 20.0000 mg | ORAL_TABLET | Freq: Every day | ORAL | 2 refills | Status: DC

## 2023-03-24 MED ORDER — BACLOFEN 10 MG PO TABS
10.0000 mg | ORAL_TABLET | Freq: Three times a day (TID) | ORAL | 0 refills | Status: DC
Start: 2023-03-24 — End: 2024-01-15

## 2023-03-24 NOTE — ED Triage Notes (Addendum)
Patient states that she has a pulled muscle in her chest that started on Tuesday.  Patient states that she only has the pain when she moves or takes a deep breath.  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  Patient denies injury or fall.  Patient states that she has not taken Lisinopril since July due to possible allergic reaction to it.

## 2023-03-24 NOTE — ED Provider Notes (Signed)
[facility]    CSN: [id number] Arrival date & time: 03/24/23  0809      History   Chief Complaint Chief Complaint  Patient presents with   Chest Pain    HPI Cammy Kamler is a 47 y.o. female.   HPI  47 year old female with a past medical history significant for hypertension and obesity presents for evaluation of right-sided chest pain that started 3 days ago.  The pain increases with movement and deep breathing.  The patient works as a Lawyer in a nursing home and she thinks she pulled a muscle, as she has done this in the past.  She has taken ibuprofen and Tylenol with minimal improvement of her symptoms.  The only thing that helps at this point is rest.  She denies any shortness of breath, sweating, nausea, or headache.  She rates her pain as a sharp 8/10.  Past Medical History:  Diagnosis Date   Hypertension     Patient Active Problem List   Diagnosis Date Noted   Elevated blood pressure reading in office with diagnosis of hypertension 03/07/2023   Pain of maxillary sinus 03/07/2023   Acute cough 03/07/2023    History reviewed. No pertinent surgical history.  OB History   No obstetric history on file.      Home Medications    Prior to Admission medications   Medication Sig Start Date End Date Taking? Authorizing Provider  baclofen (LIORESAL) 10 MG tablet Take 1 tablet (10 mg total) by mouth 3 (three) times daily. 03/24/23  Yes Becky Augusta, NP  cetirizine (ZYRTEC) 10 MG tablet Take 1 tablet by mouth daily. 10/25/18  Yes [provider]  olmesartan (BENICAR) 20 MG tablet Take 1 tablet (20 mg total) by mouth daily. 03/24/23  Yes Becky Augusta, NP    Family History History reviewed. No pertinent family history.  Social History Social History   Tobacco Use   Smoking status: Never   Smokeless tobacco: Never  Vaping Use   Vaping status: Never Used  Substance Use Topics   Alcohol use: Not Currently   Drug use: Not Currently     Allergies    Naproxen, Amlodipine, Hydralazine hcl, Lidocaine, and Venlafaxine   Review of Systems Review of Systems  Constitutional:  Negative for diaphoresis.  Respiratory:  Negative for cough and shortness of breath.   Cardiovascular:  Positive for chest pain. Negative for palpitations.  Gastrointestinal:  Negative for nausea.  Neurological:  Negative for headaches.     Physical Exam Triage Vital Signs ED Triage Vitals  Encounter Vitals Group     BP 03/24/23 0822 (!) 201/107     Systolic BP Percentile --      Diastolic BP Percentile --      Pulse Rate 03/24/23 0822 76     Resp 03/24/23 0822 15     Temp 03/24/23 0822 97.8 F (36.6 C)     Temp Source 03/24/23 0822 Oral     SpO2 03/24/23 0822 100 %     Weight 03/24/23 0821 (!) 300 lb 0.7 oz (136.1 kg)     Height 03/24/23 0821 5\' 11"  (1.803 m)     Head Circumference --      Peak Flow --      Pain Score 03/24/23 0821 8     Pain Loc --      Pain Education --      Exclude from Growth Chart --    No data found.  Updated Vital Signs BP Marland Kitchen)  201/107 (BP Location: Left Arm)   Pulse 76   Temp 97.8 F (36.6 C) (Oral)   Resp 15   Ht 5\' 11"  (1.803 m)   Wt (!) 300 lb 0.7 oz (136.1 kg)   SpO2 100%   BMI 41.85 kg/m   Visual Acuity Right Eye Distance:   Left Eye Distance:   Bilateral Distance:    Right Eye Near:   Left Eye Near:    Bilateral Near:     Physical Exam Vitals and nursing note reviewed.  Constitutional:      Appearance: Normal appearance. She is obese. She is not ill-appearing.  HENT:     Head: Normocephalic and atraumatic.  Cardiovascular:     Rate and Rhythm: Normal rate and regular rhythm.     Pulses: Normal pulses.     Heart sounds: Normal heart sounds. No murmur heard.    No friction rub. No gallop.  Pulmonary:     Effort: Pulmonary effort is normal.     Breath sounds: Normal breath sounds. No wheezing, rhonchi or rales.     Comments: Patient is tender with palpation of her right anterior mid chest near  her sternal border. Chest:     Chest wall: Tenderness present.  Skin:    General: Skin is warm and dry.     Capillary Refill: Capillary refill takes less than 2 seconds.  Neurological:     General: No focal deficit present.     Mental Status: She is alert and oriented to person, place, and time.      UC Treatments / Results  Labs (all labs ordered are listed, but only abnormal results are displayed) Labs Reviewed  BASIC METABOLIC PANEL - Abnormal; Notable for the following components:      Result Value   Sodium 133 (*)    All other components within normal limits    EKG Normal sinus rhythm with a ventricular rate of 80 bpm PR interval 160 ms QRS duration 86 ms QT/QTc 394/454 ms Possible left atrial enlargement and evidence of LVH.  Radiology No results found.  Procedures Procedures (including critical care time)  Medications Ordered in UC Medications  acetaminophen (TYLENOL) tablet 975 mg (975 mg Oral Given 03/24/23 0918)    Initial Impression / Assessment and Plan / UC Course  I have reviewed the triage vital signs and the nursing notes.  Pertinent labs & imaging results that were available during my care of the patient were reviewed by me and considered in my medical decision making (see chart for details).   Patient is a nontoxic-appearing 47 year old female presenting for evaluation of right-sided chest pain that I believe is most likely musculoskeletal in nature.  However, she does have a documented history of hypertension and she is not currently taking any antihypertensives.  She reports that she has tried 3 antihypertensives and hydralazine made her dizzy, amlodipine also cause dizziness, and she was started on lisinopril which caused her to have a cough.  She reports that she had been seeing a PCP at [facility] but she stopped going because [place].  She indicates that she saw them in October however the last visit  available in epic is from January.  She was evaluated for a breast mass at that time which showed up on mammogram but was found to be a simple cyst on ultrasound.  EKG was collected at triage which shows normal sinus rhythm with possible left atrial argument and LVH.  No other tracings are available for comparison in epic.  Review of historical vital sign trends show multiple episodes of hypertensive blood pressure.  Currently her blood pressure is 201/107.  Her last visit at her primary care office on 05/30/2022 shows a blood pressure of 165/68.  Patient reports that she is in the process of trying to find a new primary care provider but she does not currently have 1.  Given her elevated blood pressure and abnormal EKG I will obtain a BMP to evaluate her renal function and we will discuss starting another antihypertensive agent.  Since she has been a patient of [facility] in the past I will give her the contact information for [facility] clinic that she can call to establish care.  CMP shows mild hyponatremia with a sodium 133 but renal function is normal with a BUN of 20 and a creatinine of 0.4.  I will discharge patient home with a diagnosis of costochondritis and started on baclofen 10 mg every 8 hours as needed for muscle pain.  She can also continue to use over-the-counter Tylenol and apply topical lidocaine patches.  The contact information for [facility] clinic so she can call to make a new patient appointment.  For patient's blood pressure I will start her on olmesartan 20 mg once daily for control of her hypertension.  I will have her monitor her blood pressure at home and record the values.  She should take these with her to her new patient appointment once established.   Final Clinical Impressions(s) / UC Diagnoses   Final diagnoses:  Chest wall pain  Primary hypertension     Discharge Instructions      For your chest pain, this is most likely musculoskeletal given that it increases with movement  and also with pressure on your chest.  Continue to take over-the-counter Tylenol 1000 mg every 6 hours as needed for pain.  With that and when she did take the baclofen 10 mg every 8 hours.  This will help with muscle tension and pain.  You may also apply topical lidocaine patches to your chest.  They are good for 8 hours.  You may also apply moist heat or ice to your chest wall for 20 minutes at a time, 2-3 times a day, as needed for pain and inflammation.  For your blood pressure we are going to start you on olmesartan 20 mg once daily.  I want you to obtain a blood pressure cuff and monitor your blood pressure at home.  I have included the form as well as directions on how to take your blood pressure.  When you take your blood pressure you need to make sure that you have been sitting in a rested state for at least 30 minutes and that when you take your blood pressure reading you need to be sitting in the chair with your feet flat on the floor, your bladder empty, and the arm you are taking the reading and at heart level for 5 minutes prior to taking the reading.  Take the reading and record it on the sheet provided.  You need to take these readings with you when you go to your new patient appointment when you establish primary care.  Since you have been with [facility] in the past I recommend that you call [facility] to make a new patient appointment.  They have offices in both [place] and [place].  Their phone number is [phone].  They also have a walk-in clinic attached  to [facility] in [place].  If you develop any increased chest pain that radiates to your back, your neck, or your left shoulder and is associated with shortness of breath, sweating, nausea, dizziness, or headache I recommend that she go to the ER for evaluation.     ED Prescriptions     Medication Sig Dispense Auth. Provider   baclofen (LIORESAL) 10 MG tablet Take 1 tablet (10 mg total)  by mouth 3 (three) times daily. 30 each Becky Augusta, NP   olmesartan (BENICAR) 20 MG tablet Take 1 tablet (20 mg total) by mouth daily. 30 tablet Becky Augusta, NP      PDMP not reviewed this encounter.   Becky Augusta, NP 03/24/23 [phone]

## 2023-03-24 NOTE — Discharge Instructions (Addendum)
For your chest pain, this is most likely musculoskeletal given that it increases with movement and also with pressure on your chest.  Continue to take over-the-counter Tylenol 1000 mg every 6 hours as needed for pain.  With that and when she did take the baclofen 10 mg every 8 hours.  This will help with muscle tension and pain.  You may also apply topical lidocaine patches to your chest.  They are good for 8 hours.  You may also apply moist heat or ice to your chest wall for 20 minutes at a time, 2-3 times a day, as needed for pain and inflammation.  For your blood pressure we are going to start you on olmesartan 20 mg once daily.  I want you to obtain a blood pressure cuff and monitor your blood pressure at home.  I have included the form as well as directions on how to take your blood pressure.  When you take your blood pressure you need to make sure that you have been sitting in a rested state for at least 30 minutes and that when you take your blood pressure reading you need to be sitting in the chair with your feet flat on the floor, your bladder empty, and the arm you are taking the reading and at heart level for 5 minutes prior to taking the reading.  Take the reading and record it on the sheet provided.  You need to take these readings with you when you go to your new patient appointment when you establish primary care.  Since you have been with [facility] in the past I recommend that you call [facility] to make a new patient appointment.  They have offices in both [place] and [place].  Their phone number is [phone].  They also have a walk-in clinic attached to [facility] in [place].  If you develop any increased chest pain that radiates to your back, your neck, or your left shoulder and is associated with shortness of breath, sweating, nausea, dizziness, or headache I recommend that she go to the ER for evaluation.

## 2023-03-27 ENCOUNTER — Telehealth: Payer: Self-pay | Admitting: Emergency Medicine

## 2023-03-27 MED ORDER — PREDNISONE 10 MG (21) PO TBPK: ORAL_TABLET | Freq: Every day | ORAL | 0 refills | Status: DC

## 2023-03-27 NOTE — Telephone Encounter (Signed)
See CMA note for reference

## 2023-03-27 NOTE — Telephone Encounter (Signed)
Prescription from todays phone call was sent to the incorrect pharmacy. I resent the script to [facility].

## 2023-03-27 NOTE — Telephone Encounter (Signed)
Patient called with multiple concerns and upset stating she was not called back on Friday, 11/15 with concerns after her visit on 11/15. Patient stated her pain is not controlled and requesting something more for pain, pt states she has been taking tylenol and it has not been doing anything. Pt also states she took a dose of olmesartan given and started feeling like her heart was racing after one dose. 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 Spoke with Anson Crofts., NP and able to send in prednisone to her pharmacy, but unfortunately unable to extended work note. Patient stated she will try prednisone. Patient upset after her visit overall and was provided with patient relations phone number.

## 2023-05-25 ENCOUNTER — Ambulatory Visit: Payer: Managed Care, Other (non HMO) | Admitting: Family Medicine

## 2023-10-12 ENCOUNTER — Ambulatory Visit: Payer: Self-pay | Admitting: Family Medicine

## 2023-11-06 ENCOUNTER — Encounter: Payer: Self-pay | Admitting: Family Medicine

## 2023-11-06 ENCOUNTER — Ambulatory Visit: Payer: Self-pay | Admitting: Family Medicine

## 2023-11-06 VITALS — BP 169/116 | HR 116 | Temp 98.0°F | Resp 18 | Ht 71.0 in | Wt 331.0 lb

## 2023-11-06 DIAGNOSIS — I10 Essential (primary) hypertension: Secondary | ICD-10-CM

## 2023-11-06 DIAGNOSIS — F411 Generalized anxiety disorder: Secondary | ICD-10-CM

## 2023-11-06 DIAGNOSIS — N951 Menopausal and female climacteric states: Secondary | ICD-10-CM

## 2023-11-06 DIAGNOSIS — Z6841 Body Mass Index (BMI) 40.0 and over, adult: Secondary | ICD-10-CM

## 2023-11-06 DIAGNOSIS — Z7689 Persons encountering health services in other specified circumstances: Secondary | ICD-10-CM

## 2023-11-06 MED ORDER — OLMESARTAN MEDOXOMIL 5 MG PO TABS: 5.0000 mg | ORAL_TABLET | Freq: Every day | ORAL | 2 refills | Status: DC

## 2023-11-06 MED ORDER — VENLAFAXINE HCL ER 37.5 MG PO CP24: 37.5000 mg | ORAL_CAPSULE | Freq: Every day | ORAL | 2 refills | Status: DC

## 2023-11-06 NOTE — Progress Notes (Signed)
 New Patient Office Visit  Subjective   Patient ID: Suzanne Wallace, female    DOB: 10/30/1975  Age: 48 y.o. MRN: [id number]  CC:  Chief Complaint  Patient presents with   Establish Care   Sinusitis    HPI Suzanne Wallace is a 48 year old female who presents to establish with [facility] at [facility].   CC: Patient here to establish care  Last PCP: [facility]   HYPERTENSION: Suzanne Wallace is a pleasant 48 year old female patient who presents for the medical management of hypertension.  Patient's current hypertension medication regimen is: no meds  Patient is not regularly keeping a check on BP at home.  Denies headache, dizziness, CP, SHOB, vision changes.   She reports she has been on hydrochlorothiazide, lisinopril and amlodipine in the past-- with allergies/intolerance. They are listed on her allergy list but no reaction is listed. 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   BP Readings from Last 3 Encounters:  11/06/23 (!) 169/116  03/24/23 (!) 201/107  03/07/23 (!) 160/104   ANXIETY: She reports dealing with anxiety and vasomotor symptoms related to perimenopause.  She has been on Effexor in the past before and has experienced dry mouth with this medication.      11/06/2023   11:46 AM  GAD 7 : Generalized Anxiety Score  Nervous, Anxious, on Edge 0  Control/stop worrying 0  Worry too much - different things 0  Trouble relaxing 0  Restless 0  Easily annoyed or irritable 0  Afraid - awful might happen 0  Total GAD 7 Score 0  Anxiety Difficulty Not difficult at all      11/06/2023   11:09 AM  PHQ9 SCORE ONLY  PHQ-9 Total Score 2    Outpatient Encounter Medications as of 11/06/2023  Medication Sig   acetaminophen  (TYLENOL ) 325 MG tablet Take by mouth.   baclofen  (LIORESAL ) 10 MG tablet Take 1 tablet (10 mg total) by mouth 3 (three) times daily.   cetirizine (ZYRTEC) 10 MG tablet Take 1 tablet by mouth daily.    fluticasone (FLONASE) 50 MCG/ACT nasal spray Place into the nose.   ondansetron  (ZOFRAN -ODT) 4 MG disintegrating tablet Take by mouth.   venlafaxine XR (EFFEXOR XR) 37.5 MG 24 hr capsule Take 1 capsule (37.5 mg total) by mouth daily with breakfast.   olmesartan  (BENICAR ) 5 MG tablet Take 1 tablet (5 mg total) by mouth daily.   predniSONE  (STERAPRED UNI-PAK 21 TAB) 10 MG (21) TBPK tablet Take by mouth daily. Take 6 tabs by mouth daily  for 1 days, then 5 tabs for 1 days, then 4 tabs for 1 days, then 3 tabs for 1 days, 2 tabs for 1 days, then 1 tab by mouth daily for 1 days (Patient not taking: Reported on 11/06/2023)   [DISCONTINUED] citalopram (CELEXA) 10 MG tablet Take 1 tablet by mouth daily. (Patient not taking: Reported on 11/06/2023)   [DISCONTINUED] olmesartan  (BENICAR ) 20 MG tablet Take 1 tablet (20 mg total) by mouth daily. (Patient not taking: Reported on 11/06/2023)   No facility-administered encounter medications on file as of 11/06/2023.    Patient Active Problem List   Diagnosis Date Noted   Temporomandibular joint disorder 11/22/2018   Migraine without aura 04/25/2018   History of endometrial biopsy 04/24/2018   Hx of abnormal cervical Pap smear 12/06/2017   Morbid obesity with BMI of 45.0-49.9, adult (HCC) 04/06/2016   Essential hypertension 07/25/2013   Past Medical History:  Diagnosis Date   Hypertension    History reviewed. No pertinent surgical history. History reviewed. No pertinent family history. Social History   Socioeconomic History   Marital status: Single    Spouse name: Not on file   Number of children: Not on file   Years of education: Not on file   Highest education level: Not on file  Occupational History   Not on file  Tobacco Use   Smoking status: Never    Passive exposure: Never   Smokeless tobacco: Never  Vaping Use   Vaping status: Never Used  Substance and Sexual Activity   Alcohol use: Not Currently   Drug use: Not Currently   Sexual  activity: Not on file  Other Topics Concern   Not on file  Social History Narrative   Not on file   Social Drivers of Health   Financial Resource Strain: Not on file  Food Insecurity: Not on file  Transportation Needs: Not on file  Physical Activity: Not on file  Stress: Not on file  Social Connections: Not on file  Intimate Partner Violence: Not on file   Outpatient Medications Prior to Visit  Medication Sig Dispense Refill   acetaminophen  (TYLENOL ) 325 MG tablet Take by mouth.     baclofen  (LIORESAL ) 10 MG tablet Take 1 tablet (10 mg total) by mouth 3 (three) times daily. 30 each 0   cetirizine (ZYRTEC) 10 MG tablet Take 1 tablet by mouth daily.     fluticasone (FLONASE) 50 MCG/ACT nasal spray Place into the nose.     ondansetron  (ZOFRAN -ODT) 4 MG disintegrating tablet Take by mouth.     predniSONE  (STERAPRED UNI-PAK 21 TAB) 10 MG (21) TBPK tablet Take by mouth daily. Take 6 tabs by mouth daily  for 1 days, then 5 tabs for 1 days, then 4 tabs for 1 days, then 3 tabs for 1 days, 2 tabs for 1 days, then 1 tab by mouth daily for 1 days (Patient not taking: Reported on 11/06/2023) 21 tablet 0   citalopram (CELEXA) 10 MG tablet Take 1 tablet by mouth daily. (Patient not taking: Reported on 11/06/2023)     olmesartan  (BENICAR ) 20 MG tablet Take 1 tablet (20 mg total) by mouth daily. (Patient not taking: Reported on 11/06/2023) 30 tablet 2   No facility-administered medications prior to visit.   Allergies  Allergen Reactions   Naproxen Sodium Other (See Comments)    Other Reaction: Other reaction   Naproxen Palpitations and Other (See Comments)    Other Reaction: DIZZINESS INCREASE H/R   Amlodipine Other (See Comments)   Cat Dander Other (See Comments)    Allergy symptoms   Dog Epithelium (Canis Lupus Familiaris) Other (See Comments)    Allergy symptoms   Hydralazine Hcl Other (See Comments)    Polyuria   Lidocaine      Burning, I felt like it was one fire   Lisinopril Nausea  Only   Venlafaxine Other (See Comments)    Dry mouth   Latex Rash   ROS: see HPI    Objective   Today's Vitals   11/06/23 1059  BP: (!) 177/106  Pulse: (!) 103  Resp: 18  Temp: 98 F (36.7 C)  TempSrc: Oral  SpO2: 96%  Weight: (!) 331 lb (150.1 kg)  Height: 5' 11 (1.803 m)  PainSc: 3   PainLoc: Face   GENERAL: Well-appearing, in NAD. Well nourished.  SKIN: Pink, warm and dry. No rash, lesion, ulceration, or ecchymoses.  Head: Normocephalic.  NECK: Trachea midline. Full ROM w/o pain or tenderness. No lymphadenopathy.  EARS: Tympanic membranes are intact, translucent without bulging and without drainage. Appropriate landmarks visualized.  EYES: Conjunctiva clear without exudates. EOMI, PERRL, no drainage present.  NOSE: Septum midline w/o deformity. Nares patent, mucosa pink and non-inflamed w/o drainage. No sinus tenderness.  THROAT: Uvula midline. Oropharynx clear. Tonsils non-inflamed without exudate. Mucous membranes pink and moist.  RESPIRATORY: Chest wall symmetrical. Respirations even and non-labored. Breath sounds clear to auscultation bilaterally.  CARDIAC: S1, S2 present, regular rate and rhythm without murmur or gallops. Peripheral pulses 2+ bilaterally.  MSK: Muscle tone and strength appropriate for age. Joints w/o tenderness, redness, or swelling.  EXTREMITIES: Without clubbing, cyanosis, or edema.  NEUROLOGIC: No motor or sensory deficits. Steady, even gait. C2-C12 intact.  PSYCH/MENTAL STATUS: Alert, oriented x 3. Cooperative, appropriate mood and affect.     Assessment & Plan:   1. Encounter to establish care (Primary) Patient is a 54- year-old female who presents today to establish care with primary care at [facility]. Reviewed the past medical history, family history, social history, surgical history, medications and allergies today- updates made as indicated. Patient has concerns today about blood pressure, weight management, and anxiety with perimenopausal  symptoms.    2. Essential hypertension Patient presents today with elevated blood pressure, repeat blood pressure still elevated. Patient in no acute distress and is well-appearing. Denies chest pain, shortness of breath, lower extremity edema, vision changes, headaches. Cardiovascular exam with heart regular rate and rhythm. Normal heart sounds, no murmurs present. No lower extremity edema present. Lungs clear to auscultation bilaterally. Discussed pharmacotherapy with patient today. Rx sent for olmesartan  5mg  daily. Advised patient to closely monitor blood pressure as we change his dose. Return to office sooner if blood pressure begins to increase greater than 130/80. Follow-up in 2-4 weeks.    - olmesartan  (BENICAR ) 5 MG tablet; Take 1 tablet (5 mg total) by mouth daily.  Dispense: 30 tablet; Refill: 2  3. Morbid obesity with BMI of 45.0-49.9, adult (HCC) Discussed lifestyle modifications today.   4. GAD (generalized anxiety disorder) GAD7 completed with score of 0. Denies issues with panic attacks, shortness of breath, difficulty breathing, palpitations, hyperventilation, and dizziness.PHQ9 completed with score of 2. Denies active or passive suicidal ideations.   She does appear to have a lot of concern about taking medications and their side effects. Patient reports she has been on Effexor in the past and would be interested in trying this medication again. Plan for  4 week follow-up. Rx sent to pharmacy on file.  - venlafaxine XR (EFFEXOR XR) 37.5 MG 24 hr capsule; Take 1 capsule (37.5 mg total) by mouth daily with breakfast.  Dispense: 30 capsule; Refill: 2  5. Menopausal vasomotor syndrome Patient has tolerated in the past. Will start at low dose Effexor 37.5mg  daily. Will follow up in 4 weeks.  - venlafaxine XR (EFFEXOR XR) 37.5 MG 24 hr capsule; Take 1 capsule (37.5 mg total) by mouth daily with breakfast.  Dispense: 30 capsule; Refill: 2   Return in about 2 weeks (around 11/20/2023) for  HTN & mood follow-up.   Evalene Arts, FNP

## 2023-11-06 NOTE — Patient Instructions (Signed)

## 2023-11-20 ENCOUNTER — Encounter: Payer: Self-pay | Admitting: Family Medicine

## 2023-11-20 DIAGNOSIS — N951 Menopausal and female climacteric states: Secondary | ICD-10-CM

## 2023-11-20 DIAGNOSIS — F411 Generalized anxiety disorder: Secondary | ICD-10-CM

## 2023-11-20 DIAGNOSIS — I10 Essential (primary) hypertension: Secondary | ICD-10-CM

## 2023-11-27 ENCOUNTER — Ambulatory Visit: Admitting: Family Medicine

## 2023-12-04 MED ORDER — VENLAFAXINE HCL ER 37.5 MG PO CP24: 37.5000 mg | ORAL_CAPSULE | Freq: Every day | ORAL | 2 refills | Status: DC

## 2023-12-04 MED ORDER — OLMESARTAN MEDOXOMIL 5 MG PO TABS
5.0000 mg | ORAL_TABLET | Freq: Every day | ORAL | 2 refills | Status: DC
Start: 2023-12-04 — End: 2024-01-15

## 2023-12-04 NOTE — Telephone Encounter (Signed)
 Copied from CRM [id number]. Topic: Clinical - Medication Question >> Dec 04, 2023  8:50 AM Vena H wrote: Reason for CRM: Pt cancelled appt for 8/4 due to not knowing if her insurance is covering her appts or not but is wanting to know if her meds will still be refilled. Please reach out to pt

## 2023-12-11 ENCOUNTER — Ambulatory Visit: Admitting: Family Medicine

## 2023-12-14 ENCOUNTER — Ambulatory Visit: Admitting: Family Medicine

## 2023-12-19 ENCOUNTER — Ambulatory Visit: Admitting: Family Medicine

## 2024-01-01 ENCOUNTER — Ambulatory Visit: Admitting: Family Medicine

## 2024-01-01 NOTE — Progress Notes (Deleted)
   Established Patient Office Visit  Subjective  Patient ID: Suzanne Wallace, female    DOB: Jan 09, 1976  Age: 48 y.o. MRN: [id number]  No chief complaint on file.   HPI  ROS    Objective:     There were no vitals taken for this visit. BP Readings from Last 3 Encounters:  11/06/23 (!) 169/116  03/24/23 (!) 201/107  03/07/23 (!) 160/104     Physical Exam    Assessment & Plan:  There are no diagnoses linked to this encounter.   No follow-ups on file.    Evalene Arts, FNP

## 2024-01-15 ENCOUNTER — Ambulatory Visit (INDEPENDENT_AMBULATORY_CARE_PROVIDER_SITE_OTHER): Admitting: Family Medicine

## 2024-01-15 ENCOUNTER — Encounter: Payer: Self-pay | Admitting: Family Medicine

## 2024-01-15 VITALS — BP 151/99 | HR 84 | Resp 16 | Ht 71.0 in | Wt 331.0 lb

## 2024-01-15 DIAGNOSIS — N951 Menopausal and female climacteric states: Secondary | ICD-10-CM

## 2024-01-15 DIAGNOSIS — F411 Generalized anxiety disorder: Secondary | ICD-10-CM | POA: Diagnosis not present

## 2024-01-15 DIAGNOSIS — I10 Essential (primary) hypertension: Secondary | ICD-10-CM | POA: Diagnosis not present

## 2024-01-15 DIAGNOSIS — Z0000 Encounter for general adult medical examination without abnormal findings: Secondary | ICD-10-CM

## 2024-01-15 DIAGNOSIS — D509 Iron deficiency anemia, unspecified: Secondary | ICD-10-CM

## 2024-01-15 MED ORDER — OLMESARTAN MEDOXOMIL 5 MG PO TABS: 10.0000 mg | ORAL_TABLET | Freq: Every day | ORAL | 2 refills | Status: DC

## 2024-01-15 NOTE — Patient Instructions (Signed)
 Health Maintenance Recommendations Screening Testing Mammogram Every 1 -2 years based on history and risk factors Starting at age 50 Pap Smear Ages 21-39 every 3 years Ages 23-65 every 5 years with HPV testing More frequent testing may be required based on results and history Colon Cancer Screening Every 1-10 years based on test performed, risk factors, and history Starting at age 102 Bone Density Screening Every 2-10 years based on history Starting at age 69 for women Recommendations for men differ based on medication usage, history, and risk factors AAA Screening One time ultrasound Men 30-10 years old who have every smoked Lung Cancer Screening Low Dose Lung CT every 12 months Age 20-80 years with a 30 pack-year smoking history who still smoke or who have quit within the last 15 years   Screening Labs Routine  Labs: Complete Blood Count (CBC), Complete Metabolic Panel (CMP), Cholesterol (Lipid Panel) Every 6-12 months based on history and medications May be recommended more frequently based on current conditions or previous results Hemoglobin A1c Lab Every 3-12 months based on history and previous results Starting at age 24 or earlier with diagnosis of diabetes, high cholesterol, BMI >26, and/or risk factors Frequent monitoring for patients with diabetes to ensure blood sugar control Thyroid Panel (TSH w/ T3 & T4) Every 6 months based on history, symptoms, and risk factors May be repeated more often if on medication HIV One time testing for all patients 23 and older May be repeated more frequently for patients with increased risk factors or exposure Hepatitis C One time testing for all patients 47 and older May be repeated more frequently for patients with increased risk factors or exposure Gonorrhea, Chlamydia Every 12 months for all sexually active persons 13-24 years Additional monitoring may be recommended for those who are considered high risk or who have  symptoms PSA Men 72-66 years old with risk factors Additional screening may be recommended from age 2-69 based on risk factors, symptoms, and history   Vaccine Recommendations Tetanus Booster All adults every 10 years Flu Vaccine All patients 6 months and older every year COVID Vaccine All patients 12 years and older Initial dosing with booster May recommend additional booster based on age and health history HPV Vaccine 2 doses all patients age 56-26 Dosing may be considered for patients over 26 Shingles Vaccine (Shingrix) 2 doses all adults 55 years and older Pneumonia (Pneumovax 23) All adults 65 years and older May recommend earlier dosing based on health history Pneumonia (Prevnar 16) All adults 65 years and older Dosed 1 year after Pneumovax 23   Additional Screening, Testing, and Vaccinations may be recommended on an individualized basis based on family history, health history, risk factors, and/or exposure.  __________________________________________________________   Diet Recommendations for All Patients   I recommend that all patients maintain a diet low in saturated fats, carbohydrates, and cholesterol. While this can be challenging at first, it is not impossible and small changes can make big differences.  Things to try: Decreasing the amount of soda, sweet tea, and/or juice to one or less per day and replace with water While water is always the first choice, if you do not like water you may consider adding a water additive without sugar to improve the taste other sugar free drinks Replace potatoes with a brightly colored vegetable at dinner Use healthy oils, such as canola oil or olive oil, instead of butter or hard margarine Limit your bread intake to two pieces or less a day Replace regular pasta with  low carb pasta options Bake, broil, or grill foods instead of frying Monitor portion sizes  Eat smaller, more frequent meals throughout the day instead of large  meals   An important thing to remember is, if you love foods that are not great for your health, you don't have to give them up completely. Instead, allow these foods to be a reward when you have done well. Allowing yourself to still have special treats every once in a while is a nice way to tell yourself thank you for working hard to keep yourself healthy.    Also remember that every day is a new day. If you have a bad day and "fall off the wagon", you can still climb right back up and keep moving along on your journey!   We have resources available to help you!  Some websites that may be helpful include: www.http://www.wall-moore.info/        Www.VeryWellFit.com _____________________________________________________________   Activity Recommendations for All Patients   I recommend that all adults get at least 20 minutes of moderate physical activity that elevates your heart rate at least 5 days out of the week.  Some examples include: Walking or jogging at a pace that allows you to carry on a conversation Cycling (stationary bike or outdoors) Water aerobics Yoga Weight lifting Dancing If physical limitations prevent you from putting stress on your joints, exercise in a pool or seated in a chair are excellent options.   Do determine your MAXIMUM heart rate for activity: YOUR AGE - 220 = MAX HeartRate    Remember! Do not push yourself too hard.  Start slowly and build up your pace, speed, weight, time in exercise, etc.  Allow your body to rest between exercise and get good sleep. You will need more water than normal when you are exerting yourself. Do not wait until you are thirsty to drink. Drink with a purpose of getting in at least 8, 8 ounce glasses of water a day plus more depending on how much you exercise and sweat.      If you begin to develop dizziness, chest pain, abdominal pain, jaw pain, shortness of breath, headache, vision changes, lightheadedness, or other concerning symptoms, stop the  activity and allow your body to rest. If your symptoms are severe, seek emergency evaluation immediately. If your symptoms are concerning, but not severe, please let us know so that we can recommend further evaluation.

## 2024-01-15 NOTE — Progress Notes (Signed)
 Established Patient Office Visit  Subjective  Patient ID: Suzanne Wallace, female    DOB: 03-17-1976  Age: 48 y.o. MRN: [id number]  Chief Complaint  Patient presents with   Hypertension   Discussed the use of AI scribe software for clinical note transcription with the patient, who gave verbal consent to proceed.  History of Present Illness   Suzanne Wallace is a 48 year old female with hypertension and anxiety who presents for a follow-up on blood pressure and mood.  She has been inconsistently monitoring her blood pressure at home, with readings typically around 130s/90s mmHg. She reports having headaches. Denies chest pain, shortness of breath, vision changes, peripheral edema. She is concerned about an upcoming mammogram due to a history of non-cancerous cysts, which she feels may be contributing to her anxiety and affecting her blood pressure. She has not been sleeping well recently and feels very tired.  She has a history of anxiety and is currently taking Effexor  37.5 mg daily. She experiences increased anxiety and panic attacks, particularly related to her upcoming mammogram. She experiences dry mouth as a side effect of Effexor , which she manages by increasing her water intake. She also reports difficulty sleeping and increased anxiety over the past few days.  She reports improvement of perimenopausal symptoms with Effexor . Missing a dose leads to the return of menopausal symptoms. She also reports a recent heavy menstrual period and a history of anemia, for which she was advised to take iron supplements for life. She has made dietary changes, including reducing red meat intake and increasing fruits, vegetables, and water consumption. She reports feeling cold and experiencing blurry vision at times. She has a history of UTIs related to dehydration and is concerned about maintaining adequate hydration.          01/15/2024   10:19 AM 11/06/2023   11:46 AM  GAD 7 : Generalized Anxiety Score   Nervous, Anxious, on Edge 1 0  Control/stop worrying 1 0  Worry too much - different things 1 0  Trouble relaxing 0 0  Restless 0 0  Easily annoyed or irritable 0 0  Afraid - awful might happen 0 0  Total GAD 7 Score 3 0  Anxiety Difficulty Not difficult at all Not difficult at all      01/15/2024   10:20 AM 11/06/2023   11:09 AM  PHQ9 SCORE ONLY  PHQ-9 Total Score 0 2      Data saved with a previous flowsheet row definition   ROS: see HPI     Objective:     BP (!) 145/91   Pulse 84   Resp 16   Ht 5' 11 (1.803 m)   SpO2 99%   BMI 46.17 kg/m  BP Readings from Last 3 Encounters:  01/15/24 (!) 145/91  11/06/23 (!) 169/116  03/24/23 (!) 201/107     Physical Exam Vitals reviewed.  Constitutional:      Appearance: Normal appearance.  Cardiovascular:     Rate and Rhythm: Normal rate and regular rhythm.     Pulses: Normal pulses.     Heart sounds: Normal heart sounds.  Pulmonary:     Effort: Pulmonary effort is normal.     Breath sounds: Normal breath sounds.  Neurological:     Mental Status: She is alert.  Psychiatric:        Mood and Affect: Mood normal.        Behavior: Behavior normal.     Assessment & Plan:  1. Essential hypertension (Primary) Blood pressure improved with home readings averaging 130s/90s mmHg. Advised patient to check blood pressure daily at home. Increase olmesartan  from 5mg  once daily to 10 mg once daily. - olmesartan  (BENICAR ) 5 MG tablet; Take 2 tablets (10 mg total) by mouth daily.  Dispense: 60 tablet; Refill: 2  2. GAD (generalized anxiety disorder) Increased anxiety and sleep disturbance related to upcoming mammogram. Effexor  helps menopausal symptoms but causes dry mouth. Melatonin and magnesium improve sleep. Discussed increasing Effexor  (take two 37.5 mg tablets) to improve anxiety with minimal additional dry mouth. Patient wishes to remain on current dose to prevent increase in adverse side effects. Discussed use of hydroxyzine  for acute anxiety attacks and sleep and anxiety if needed- patient declined at this time. Continue melatonin and magnesium as needed for sleep.  3. Perimenopausal vasomotor syndrome Effexor  effectively manages symptoms, with recurrence if missed. Recent menstrual cycle indicates not yet in menopause.  4. Healthcare maintenance Perform fasting labs prior to physical.  - CBC with Differential/Platelet - Comprehensive metabolic panel with GFR - Hemoglobin A1c - Lipid panel - TSH Rfx on Abnormal to Free T4 - VITAMIN D 25 Hydroxy (Vit-D Deficiency, Fractures)  5. Iron deficiency anemia, unspecified iron deficiency anemia type History of iron deficiency. Will check iron panel today with fasting labs.  - Iron - Iron Binding Cap (TIBC)([facility]) - Ferritin - Transferrin Saturation   Return in about 4 weeks (around 02/12/2024) for Physical (NO fasting labs needed) .    Evalene Arts, FNP

## 2024-01-22 LAB — COMPREHENSIVE METABOLIC PANEL WITH GFR
ALT: 13 IU/L (ref 0–32)
AST: 11 IU/L (ref 0–40)
Albumin: 3.8 g/dL — ABNORMAL LOW (ref 3.9–4.9)
Alkaline Phosphatase: 135 IU/L — ABNORMAL HIGH (ref 44–121)
BUN/Creatinine Ratio: 17 (ref 9–23)
BUN: 15 mg/dL (ref 6–24)
Bilirubin Total: 0.3 mg/dL (ref 0.0–1.2)
CO2: 24 mmol/L (ref 20–29)
Calcium: 9.3 mg/dL (ref 8.7–10.2)
Chloride: 101 mmol/L (ref 96–106)
Creatinine, Ser: 0.86 mg/dL (ref 0.57–1.00)
Globulin, Total: 3.9 g/dL (ref 1.5–4.5)
Glucose: 92 mg/dL (ref 70–99)
Potassium: 4.6 mmol/L (ref 3.5–5.2)
Sodium: 137 mmol/L (ref 134–144)
Total Protein: 7.7 g/dL (ref 6.0–8.5)
eGFR: 82 mL/min/1.73 (ref >59)

## 2024-01-22 LAB — CBC WITH DIFFERENTIAL/PLATELET
Basophils Absolute: 0 x10E3/uL (ref 0.0–0.2)
Basos: 1 %
EOS (ABSOLUTE): 0.1 x10E3/uL (ref 0.0–0.4)
Eos: 3 %
Hematocrit: 40.5 % (ref 34.0–46.6)
Hemoglobin: 12.5 g/dL (ref 11.1–15.9)
Immature Grans (Abs): 0 x10E3/uL (ref 0.0–0.1)
Immature Granulocytes: 0 %
Lymphocytes Absolute: 1.6 x10E3/uL (ref 0.7–3.1)
Lymphs: 44 %
MCH: 25.7 pg — ABNORMAL LOW (ref 26.6–33.0)
MCHC: 30.9 g/dL — ABNORMAL LOW (ref 31.5–35.7)
MCV: 83 fL (ref 79–97)
Monocytes Absolute: 0.3 x10E3/uL (ref 0.1–0.9)
Monocytes: 8 %
Neutrophils Absolute: 1.6 x10E3/uL (ref 1.4–7.0)
Neutrophils: 43 %
Platelets: 313 x10E3/uL (ref 150–450)
RBC: 4.87 x10E6/uL (ref 3.77–5.28)
RDW: 13.1 % (ref 11.7–15.4)
WBC: 3.6 x10E3/uL (ref 3.4–10.8)

## 2024-01-22 LAB — HEMOGLOBIN A1C
Est. average glucose Bld gHb Est-mCnc: 120 mg/dL
Hgb A1c MFr Bld: 5.8 % — ABNORMAL HIGH (ref 4.8–5.6)

## 2024-01-22 LAB — LIPID PANEL
Chol/HDL Ratio: 2.6 ratio (ref 0.0–4.4)
Cholesterol, Total: 134 mg/dL (ref 100–199)
HDL: 52 mg/dL (ref >39)
LDL Chol Calc (NIH): 72 mg/dL (ref 0–99)
Triglycerides: 42 mg/dL (ref 0–149)
VLDL Cholesterol Cal: 10 mg/dL (ref 5–40)

## 2024-01-22 LAB — TSH RFX ON ABNORMAL TO FREE T4: TSH: 0.935 u[IU]/mL (ref 0.450–4.500)

## 2024-01-22 LAB — VITAMIN D 25 HYDROXY (VIT D DEFICIENCY, FRACTURES): Vit D, 25-Hydroxy: 18 ng/mL — ABNORMAL LOW (ref 30.0–100.0)

## 2024-01-22 LAB — FERRITIN: Ferritin: 105 ng/mL (ref 15–150)

## 2024-01-22 LAB — IRON AND TIBC
Iron Saturation: 17 % (ref 15–55)
Iron: 57 ug/dL (ref 27–159)
Total Iron Binding Capacity: 343 ug/dL (ref 250–450)
UIBC: 286 ug/dL (ref 131–425)

## 2024-01-22 LAB — TRANSFERRIN SATURATION
IRON SATN MFR SERPL: 15 %{saturation}
IRON SERPL-MCNC: 55 ug/dL
TRANSFERRIN SERPL-MCNC: 267 mg/dL

## 2024-01-26 ENCOUNTER — Encounter: Payer: Self-pay | Admitting: Family Medicine

## 2024-01-26 ENCOUNTER — Other Ambulatory Visit: Payer: Self-pay | Admitting: Family Medicine

## 2024-01-26 DIAGNOSIS — I10 Essential (primary) hypertension: Secondary | ICD-10-CM

## 2024-01-26 MED ORDER — OLMESARTAN MEDOXOMIL 20 MG PO TABS: 10.0000 mg | ORAL_TABLET | Freq: Every day | ORAL | 1 refills | Status: DC

## 2024-01-26 NOTE — Telephone Encounter (Unsigned)
 Copied from CRM [id number]. Topic: Clinical - Prescription Issue >> Jan 26, 2024  9:17 AM Pinkey ORN wrote: Reason for CRM: olmesartan  (BENICAR ) 5 MG tablet >> Jan 26, 2024  9:19 AM Pinkey ORN wrote: Patient states insurance won't cover her refill because it's too soon. Patient states the dosage needs to be changed from 5MG  to 10 MG in hopes that the insurance will approve. Please follow up with patient.

## 2024-01-31 ENCOUNTER — Encounter: Payer: Self-pay | Admitting: Family Medicine

## 2024-02-01 ENCOUNTER — Ambulatory Visit: Payer: Self-pay

## 2024-02-01 NOTE — Telephone Encounter (Signed)
 FYI Only or Action Required?: Action required by provider: referral request and Requesting an alternative medication for blood pressure.  Patient was last seen in primary care on 01/15/2024 by Towana Small, FNP.  Called Nurse Triage reporting Medication Reaction.  Symptoms began a week ago.  Interventions attempted: Nothing.  Symptoms are: gradually worsening.  Triage Disposition: Call PCP Now  Patient/caregiver understands and will follow disposition?: Yes     Copied from CRM [id number]. Topic: Clinical - Red Word Triage >> Feb 01, 2024  3:29 PM Joesph NOVAK wrote: Red Word that prompted transfer to Nurse Triage:  Pt experiencing excruciating pain since medication has changed. The medicine makes her stomach hurt. She wants a different medication. Reason for Disposition  [1] Caller has URGENT medicine question about med that primary care doctor (or NP/PA) or specialist prescribed AND [2] triager unable to answer question  Answer Assessment - Initial Assessment Questions Patient states she would like an alternative blood pressure medication due to side effects of symptoms. She states she needs a medication with a coating on it and states she did better with the old medication. Patient also requesting a referral to a specialist due to issues with various blood pressure medications. Patient would like a callback regarding request. Pharmacy listed below.   [id number] - [place], [place] - [address] AT [facility] OF [address]  [address], [place] [address]     1. NAME of MEDICINE: What medicine(s) are you calling about?     olmesartan  (BENICAR ) 5 MG tablet 2. QUESTION: What is your question? (e.g., double dose of medicine, side effect)     Patient requesting if she can have a different medication due to  3. PRESCRIBER: Who prescribed the medicine? Reason: if prescribed by specialist, call should be referred to that group.     PCP  4. SYMPTOMS: Do  you have any symptoms? If Yes, ask: What symptoms are you having?  How bad are the symptoms (e.g., mild, moderate, severe)     Abdominal pain 4 or 5 out of 10 when taking the medication. Also had cramping and abdominal swelling as well.  Protocols used: Medication Question Call-A-AH

## 2024-02-02 ENCOUNTER — Ambulatory Visit: Payer: Self-pay | Admitting: *Deleted

## 2024-02-02 ENCOUNTER — Ambulatory Visit: Admitting: Physician Assistant

## 2024-02-02 ENCOUNTER — Encounter: Payer: Self-pay | Admitting: Physician Assistant

## 2024-02-02 VITALS — BP 154/106 | HR 91 | Temp 98.6°F | Ht 71.0 in | Wt 326.0 lb

## 2024-02-02 DIAGNOSIS — E559 Vitamin D deficiency, unspecified: Secondary | ICD-10-CM

## 2024-02-02 DIAGNOSIS — R1030 Lower abdominal pain, unspecified: Secondary | ICD-10-CM

## 2024-02-02 DIAGNOSIS — Z23 Encounter for immunization: Secondary | ICD-10-CM

## 2024-02-02 DIAGNOSIS — J342 Deviated nasal septum: Secondary | ICD-10-CM

## 2024-02-02 DIAGNOSIS — R7303 Prediabetes: Secondary | ICD-10-CM

## 2024-02-02 DIAGNOSIS — G4726 Circadian rhythm sleep disorder, shift work type: Secondary | ICD-10-CM

## 2024-02-02 DIAGNOSIS — F411 Generalized anxiety disorder: Secondary | ICD-10-CM | POA: Diagnosis not present

## 2024-02-02 DIAGNOSIS — N951 Menopausal and female climacteric states: Secondary | ICD-10-CM | POA: Diagnosis not present

## 2024-02-02 DIAGNOSIS — I10 Essential (primary) hypertension: Secondary | ICD-10-CM

## 2024-02-02 DIAGNOSIS — R29818 Other symptoms and signs involving the nervous system: Secondary | ICD-10-CM

## 2024-02-02 MED ORDER — VENLAFAXINE HCL ER 75 MG PO CP24: 75.0000 mg | ORAL_CAPSULE | Freq: Every day | ORAL | 0 refills | Status: DC

## 2024-02-02 MED ORDER — OLMESARTAN MEDOXOMIL 5 MG PO TABS
5.0000 mg | ORAL_TABLET | Freq: Every day | ORAL | Status: DC
Start: 2024-02-02 — End: 2024-02-20

## 2024-02-02 MED ORDER — CHOLECALCIFEROL 1.25 MG (50000 UT) PO CAPS: 50000.0000 [IU] | ORAL_CAPSULE | ORAL | 0 refills | Status: AC

## 2024-02-02 NOTE — Patient Instructions (Signed)
-  It was a pleasure to see you today! Please review your visit summary for helpful information -I would encourage you to follow your care via MyChart where you can access lab results, notes, messages, and more -If you feel that we did a nice job today, please complete your after-visit survey and leave [place] a Google review! Your CMA today was Mariann Barter and your provider was Alvester Morin, PA-C, DMSc -Please return for follow-up in about 2 weeks

## 2024-02-02 NOTE — Telephone Encounter (Signed)
 Reason for Disposition  [1] Caller has URGENT medicine question about med that primary care doctor (or NP/PA) or specialist prescribed AND [2] triager unable to answer question  Answer Assessment - Initial Assessment Questions 1. NAME of MEDICINE: What medicine(s) are you calling about?     BP medicine is making my stomach hurt.   I was prescribed a new BP medicine and it's making my stomach hurts.   My car is broke down so I don't have transportation.   Dr. Ziglar wants me to cut my BP pill in half.   I'm doing that but my stomach is hurting and my stomach is swollen up.   This started Sunday.   I have not hurt back from Evalene Arts, FNP.       (I am scheduled with another provider with another practice)  2. QUESTION: What is your question? (e.g., double dose of medicine, side effect)     Why can't I see my doctor?    I have not heard back from Evalene Arts, FNP.    Why is Dr. Ziglar telling me to keep taking the medicine.    They want to send me to another doctor.    Pt very upset.    3. PRESCRIBER: Who prescribed the medicine? Reason: if prescribed by specialist, call should be referred to that group.     Evalene Arts, FNP 4. SYMPTOMS: Do you have any symptoms? If Yes, ask: What symptoms are you having?  How bad are the symptoms (e.g., mild, moderate, severe)     My stomach hurts and is swollen.     5. PREGNANCY:  Is there any chance that you are pregnant? When was your last menstrual period?     Not asked  Protocols used: Medication Question Call-A-AH

## 2024-02-02 NOTE — Telephone Encounter (Signed)
 I cancelled the appt for 02/02/2024 with Toribio Hoyle, PA at [facility] [place].   No transportation and pt unwilling to be seen by another provider at a different practice.   She is a pt at [facility] and had been scheduled with Toribio Hoyle because there were no appts at [facility].

## 2024-02-02 NOTE — Telephone Encounter (Signed)
 FYI Only or Action Required?: Action required by provider: request for appointment, clinical question for provider, and update on patient condition.  Patient was last seen in primary care on 01/15/2024 by Towana Small, FNP.  Called Nurse Triage reporting Medication Problem.  Symptoms began a week ago.  Interventions attempted: Prescription medications: rx was cut in half per Dr. Ziglar's instructions but pt still having stomach pain and swelling with dizzziness.  Symptoms are: rapidly worsening.  Triage Disposition: Call PCP Now Call was warm transferred into [facility] to [place].   Small Towana, FNP is out of the office all week and there are no appts available.     Patient/caregiver understands and will follow disposition?: Pt called in c/o the new BP medication making her stomach hurt and swell and she is having dizziness.  She was scheduled to see Toribio Hoyle, PA at [facility] [place] because there are no appts with [facility].   She is very upset that she was scheduled with another doctor at a different office.   He don't know about my BP medicine and all.    My car is broke down so I can't drive anywhere.   I sent a message to Small Towana, FNP but I never heard back from her.    Dr. Ziglar told me to cut the pill in half and take it.   I was told I was not to cut that pill in half but Dr. Ziglar said she talked with the pharmacist and it's ok to cut the pill in half.   I did that but my stomach is still hurting real bad and I'm dizzy and lightheaded.   I was sent home from work last night because my boss thought I was going to pass out.   She will excuse the absence if I can see my doctor or get a note.   This medicine is not working for me.   Why can't I see my regular doctor?   Why is Small not answering my messages?   I'm really upset with [facility].   I don't understand why I was scheduled with another  provider.  At this point I warm transferred the call into [facility] to [place] because there are no appts available and Small Towana, FNP is out of the office all week.   They want pt to go to an urgent care.    I let Miranda know the pt was already upset and was not going to take that news very well so Cena agreed to talk with pt.   I dropped off the call once they were connected and speaking with each other.

## 2024-02-02 NOTE — Progress Notes (Signed)
 Date:  02/02/2024   Name:  Suzanne Wallace   DOB:  Jun 03, 1975   MRN:  [id number]   Chief Complaint: Medical Management of Chronic Issues (Note for work for coming today ) and Hypertension (X 2 days, Side effects- dizzy, has to brace self at work, light headed, lower abdominal pain, worst than menstrual cramps, has taken  10 MG since Wednesday, took 5 mg Wednesday and not the 10 MG, does not see cardiologist, does not eat salt, 5 pain scale, bloating, gassy, referral to cardiologist )  HPI  Suzanne Wallace is a very pleasant 48 y.o. female with history of HTN, obesity, preDM, anxiety, Vit D deficiency, and night shift work who presents new to our practice today for a medication issue. She has been having lower abdominal/pelvic pain and dizziness since her dose of olmesartan  was increased to 10 mg daily a few weeks ago. She states this also coincides with her menstrual cycle so she wonders if maybe it was premenstrual cramping; she reports that she is in perimenopause and actually did not have any bleeding as expected, and still hasn't had her menses this month. Regardless, she does not want to continue the current dose of olmesartan , says the pill itself changed to a white rough pill instead of her peach-colored coated pill (the olmesartan  5 mg).   She works as a Lawyer, night shift. Her sleep is broken and leaves her feeling tired. She has recently discovered Vitamin D  deficiency and is not presently supplementing. She complains of dry mouth, sleeps with her mouth open due to sinus congestion. She is not aware of any snoring/apnea.   She has a lot of anxiety about her health. PCP wanted to increase venlafaxine  dose to 75 mg and while she was reluctant at the time, she thinks the dose increase is necessary now. As she understands it, her ovarian cysts migrated to her chest which worries her. To clarify, I reviewed her imaging dating back through 2020 at which point she had a pelvic ultrasound showing simple ovarian  cysts that were monitored and showed interval decrease in size. No recent pelvic imaging. Earlier this year she had benign simple cysts on her mammogram confirmed with ultrasound, and just repeated her mammogram a few weeks ago which shows no concerning findings. I reassured her that the cysts did not migrate.    Medication list has been reviewed and updated.  Current Meds  Medication Sig   acetaminophen  (TYLENOL ) 325 MG tablet Take by mouth.   cetirizine (ZYRTEC) 10 MG tablet Take 1 tablet by mouth daily.   Cholecalciferol  1.25 MG (50000 UT) capsule Take 1 capsule (50,000 Units total) by mouth once a week for 12 doses.   fluticasone (FLONASE) 50 MCG/ACT nasal spray Place into the nose.   [DISCONTINUED] venlafaxine  XR (EFFEXOR  XR) 37.5 MG 24 hr capsule Take 1 capsule (37.5 mg total) by mouth daily with breakfast.     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   Deviated nasal septum 02/02/2024   Suspected sleep apnea 02/02/2024   Menopausal vasomotor syndrome 02/02/2024   GAD (generalized anxiety disorder) 02/02/2024   Prediabetes 02/02/2024   Temporomandibular joint disorder 11/22/2018   Migraine without aura 04/25/2018   History of endometrial biopsy 04/24/2018   Hx of abnormal cervical Pap smear 12/06/2017   Morbid obesity with BMI of 45.0-49.9, adult (HCC) 04/06/2016   Essential hypertension 07/25/2013  Allergies  Allergen Reactions   Naproxen Sodium Other (See Comments)    Other Reaction: Other reaction   Naproxen Palpitations and Other (See Comments)    Other Reaction: DIZZINESS INCREASE H/R   Amlodipine Other (See Comments)   Cat Dander Other (See Comments)    Allergy symptoms   Dog Epithelium (Canis Lupus Familiaris) Other (See Comments)    Allergy symptoms   Hydralazine Hcl Other (See Comments)    Polyuria   Lidocaine      Burning, I felt like it was one fire    Lisinopril Nausea Only   Venlafaxine  Other (See Comments)    Dry mouth   Latex Rash    Immunization History  Administered Date(s) Administered   Hep B, Unspecified 11/03/2011, 11/22/2011, 01/16/2012, 06/12/2012, 07/30/2012   Influenza, Seasonal, Injecte, Preservative Fre 05/29/2023   Influenza,inj,Quad PF,6+ Mos 01/12/2021   Influenza,trivalent, recombinat, inj, PF 02/02/2024   Influenza-Unspecified 01/06/2022   MMR 04/09/1999, 11/03/2011   PPD Test 06/17/2021, 06/14/2022   Tdap 11/04/2011, 01/06/2022   Unspecified SARS-COV-2 Vaccination 10/23/2019, 11/13/2019, 06/17/2020   Varicella 11/03/2011    History reviewed. No pertinent surgical history.  Social History   Tobacco Use   Smoking status: Never    Passive exposure: Never   Smokeless tobacco: Never  Vaping Use   Vaping status: Never Used  Substance Use Topics   Alcohol use: Not Currently   Drug use: Not Currently    History reviewed. No pertinent family history.      02/02/2024   10:57 AM 01/15/2024   10:19 AM 11/06/2023   11:46 AM  GAD 7 : Generalized Anxiety Score  Nervous, Anxious, on Edge 1 1 0  Control/stop worrying 1 1 0  Worry too much - different things 1 1 0  Trouble relaxing 1 0 0  Restless 0 0 0  Easily annoyed or irritable 1 0 0  Afraid - awful might happen 0 0 0  Total GAD 7 Score 5 3 0  Anxiety Difficulty Somewhat difficult Not difficult at all Not difficult at all       02/02/2024   10:50 AM 01/15/2024   10:20 AM 11/06/2023   11:09 AM  Depression screen PHQ 2/9  Decreased Interest 1 0 0  Down, Depressed, Hopeless 1 0 0  PHQ - 2 Score 2 0 0  Altered sleeping 3 0 1  Tired, decreased energy 3 0 0  Change in appetite 3 0 1  Feeling bad or failure about yourself  0 0 0  Trouble concentrating 0 0 0  Moving slowly or fidgety/restless 0 0 0  Suicidal thoughts 0 0 0  PHQ-9 Score 11 0 2  Difficult doing work/chores Somewhat difficult Not difficult at all Not difficult at all    BP Readings  from Last 3 Encounters:  02/02/24 (!) 154/106  01/15/24 (!) 151/99  11/06/23 (!) 169/116    Wt Readings from Last 3 Encounters:  02/02/24 (!) 326 lb (147.9 kg)  01/15/24 (!) 331 lb (150.1 kg)  11/06/23 (!) 331 lb (150.1 kg)    BP (!) 154/106   Pulse 91   Temp 98.6 F (37 C)   Ht 5' 11 (1.803 m)   Wt (!) 326 lb (147.9 kg)   SpO2 99%   BMI 45.47 kg/m   Physical Exam Vitals and nursing note reviewed.  Constitutional:      Appearance: Normal appearance. She is obese.  HENT:     Nose: Septal deviation (toward right, moderate) present.  Neck:  Vascular: No carotid bruit.  Cardiovascular:     Rate and Rhythm: Normal rate and regular rhythm.     Heart sounds: No murmur heard.    No friction rub. No gallop.  Pulmonary:     Effort: Pulmonary effort is normal.     Breath sounds: Normal breath sounds.  Abdominal:     General: There is no distension.  Musculoskeletal:        General: Normal range of motion.  Skin:    General: Skin is warm and dry.  Neurological:     Mental Status: She is alert and oriented to person, place, and time.     Gait: Gait is intact.  Psychiatric:        Mood and Affect: Mood and affect normal.     Recent Labs     Component Value Date/Time   NA 137 01/15/2024 1053   K 4.6 01/15/2024 1053   CL 101 01/15/2024 1053   CO2 24 01/15/2024 1053   GLUCOSE 92 01/15/2024 1053   GLUCOSE 99 03/24/2023 0907   BUN 15 01/15/2024 1053   CREATININE 0.86 01/15/2024 1053   CALCIUM 9.3 01/15/2024 1053   PROT 7.7 01/15/2024 1053   ALBUMIN 3.8 (L) 01/15/2024 1053   AST 11 01/15/2024 1053   ALT 13 01/15/2024 1053   ALKPHOS 135 (H) 01/15/2024 1053   BILITOT 0.3 01/15/2024 1053   GFRNONAA >60 03/24/2023 0907    Lab Results  Component Value Date   WBC 3.6 01/15/2024   HGB 12.5 01/15/2024   HCT 40.5 01/15/2024   MCV 83 01/15/2024   PLT 313 01/15/2024   Lab Results  Component Value Date   HGBA1C 5.8 (H) 01/15/2024   Lab Results  Component Value  Date   CHOL 134 01/15/2024   HDL 52 01/15/2024   LDLCALC 72 01/15/2024   TRIG 42 01/15/2024   CHOLHDL 2.6 01/15/2024   Lab Results  Component Value Date   TSH 0.935 01/15/2024      Assessment and Plan:  1. Lower abdominal pain (Primary) Possibly drug intolerance vs simple premenstrual cramping. Given her hs ovarian cysts, offered repeat pelvic u/s but we will wait to see if this improves in the next 2 weeks.   2. GAD (generalized anxiety disorder) Increase venlafaxine  to 75 mg for both anxiety and perimenopausal symptoms. - venlafaxine  XR (EFFEXOR  XR) 75 MG 24 hr capsule; Take 1 capsule (75 mg total) by mouth daily with breakfast.  Dispense: 30 capsule; Refill: 0  3. Menopausal vasomotor syndrome Increase venlafaxine  to 75 mg for both anxiety and perimenopausal symptoms.  - venlafaxine  XR (EFFEXOR  XR) 75 MG 24 hr capsule; Take 1 capsule (75 mg total) by mouth daily with breakfast.  Dispense: 30 capsule; Refill: 0  4. Essential hypertension Patient has olmesartan  5 mg waiting for her at the pharmacy, which I also confirmed with them. She is agreeable to starting back at this dose. I have also encouraged home BP monitoring, preferably with an arm cuff, XL.  - olmesartan  (BENICAR ) 5 MG tablet; Take 1 tablet (5 mg total) by mouth daily.  5. Vitamin D  deficiency Explained could contribute to depressed mood and fatigue among other symptoms. While it may not be the only cause of your symptoms, it is likely contributing. High-dose Vit D to be taken once weekly for the next 2-3 months. Afterwards, she can probably maintain healthy vitamin D  levels with: 1. Intentional modification of diet to include more vitamin D  rich foods, 2. Daily multivitamin  or lower dose vitamin D  supplement of 1000 IU daily.  3. Small amounts of exposure to sunlight, about 5-10 minutes per day.   - Cholecalciferol  1.25 MG (50000 UT) capsule; Take 1 capsule (50,000 Units total) by mouth once a week for 12 doses.   Dispense: 12 capsule; Refill: 0  6. Suspected sleep apnea Discussed suspicion based on dry mouth, poor sleep, fragmented sleep, shift work, and weight. Will pre-screen with [employer] app. She might consider sleep study at some point in the future. She would likely benefit from Zepbound which we can discuss at a later date if desired.   7. Shift work sleep disorder Discussed that shiftwork is related to increased risk and severity of multiple metabolic conditions and also mood disorders.  Would be best if she could switch to dayshift, but she enjoys the incentive payment associated with her night shift work.  8. Deviated nasal septum Discussed with patient likely contributing to her poor sleep and sinus issues.  Offered ENT referral but she would like to defer at this time, might consider for next year.  9. Encounter for immunization Egg-free flu shot given today.  - Flu vaccine, recombinant, trivalent, inj  10. Prediabetes A1c 5.8% on 01/15/24. She would like to set an initial goal of a walk around the neighborhood 3x per week. I think this is a great start.      Patient desires transfer of care to this clinic with me as her PCP moving forward.   Return in about 2 weeks (around 02/16/2024) for OV f/u HTN, anx.   I personally spent a total of 65 minutes in the care of the patient today including preparing to see the patient, performing a medically appropriate exam/evaluation, counseling and educating, placing orders, documenting clinical information in the EHR, communicating results, and coordinating care.   Rolan Hoyle, PA-C, DMSc, Nutritionist [facility] [phone]

## 2024-02-05 ENCOUNTER — Ambulatory Visit: Payer: Self-pay | Admitting: Family Medicine

## 2024-02-13 ENCOUNTER — Encounter: Admitting: Family Medicine

## 2024-02-14 ENCOUNTER — Encounter: Admitting: Family Medicine

## 2024-02-16 ENCOUNTER — Ambulatory Visit: Admitting: Physician Assistant

## 2024-02-20 ENCOUNTER — Encounter: Payer: Self-pay | Admitting: Physician Assistant

## 2024-02-20 ENCOUNTER — Ambulatory Visit: Admitting: Physician Assistant

## 2024-02-20 VITALS — BP 152/102 | HR 110 | Ht 71.0 in | Wt 325.2 lb

## 2024-02-20 DIAGNOSIS — H6993 Unspecified Eustachian tube disorder, bilateral: Secondary | ICD-10-CM | POA: Diagnosis not present

## 2024-02-20 DIAGNOSIS — F411 Generalized anxiety disorder: Secondary | ICD-10-CM | POA: Diagnosis not present

## 2024-02-20 DIAGNOSIS — N951 Menopausal and female climacteric states: Secondary | ICD-10-CM

## 2024-02-20 DIAGNOSIS — I10 Essential (primary) hypertension: Secondary | ICD-10-CM | POA: Diagnosis not present

## 2024-02-20 DIAGNOSIS — Z789 Other specified health status: Secondary | ICD-10-CM | POA: Insufficient documentation

## 2024-02-20 MED ORDER — SPIRONOLACTONE 25 MG PO TABS: 25.0000 mg | ORAL_TABLET | Freq: Every day | ORAL | 1 refills | Status: DC

## 2024-02-20 MED ORDER — OLMESARTAN MEDOXOMIL 5 MG PO TABS: 10.0000 mg | ORAL_TABLET | Freq: Every day | ORAL | 1 refills | Status: DC

## 2024-02-20 MED ORDER — VENLAFAXINE HCL ER 37.5 MG PO CP24
37.5000 mg | ORAL_CAPSULE | Freq: Every day | ORAL | 1 refills | Status: AC
Start: 2024-02-20 — End: ?

## 2024-02-20 NOTE — Assessment & Plan Note (Signed)
 Continue venlafaxine  which she states has helped some with her anxiety and palpitations

## 2024-02-20 NOTE — Assessment & Plan Note (Signed)
 Discussed with patient that her blood pressure remains inadequately controlled in clinic and at home. She does not want to increase olmesartan  dose. Explained that we have to do something. Will try spironolactone for her, and this might even have some benefit for hot flashes. Continue home BP monitoring.

## 2024-02-20 NOTE — Assessment & Plan Note (Signed)
 Referring to ENT. Consider Singulair but do not want to make too many changes at once.

## 2024-02-20 NOTE — Progress Notes (Signed)
 Date:  02/20/2024   Name:  Suzanne Wallace   DOB:  May 29, 1975   MRN:  [id number]   Chief Complaint: Hypertension  HPI  Tangy presents today for 3-week follow-up on chronic conditions after establishing care with our practice 02/02/2024.  At that visit we increased venlafaxine  XR to 75 mg daily but she feels this has not really helped her symptoms of anxiety and hot flashes, and has worsened her dry mouth.   Similarly, she feels that cetirizine has not helped with her allergies/nasal congestion/ETD and has only contributed to dry mouth.  Last time I noted her deviated septum, and offered ENT referral but it was declined at that time. She has never used Singulair, but says that her sister had hallucinations on it which I find surprising.  Her son uses it without issue.  Blood pressure has been running high (140's) despite using olmesartan  10 mg daily with good compliance.  She reports prior issue with lisinopril, amlodipine, HCTZ for various reasons.  Left wrist cuff 146/90 during visit today.   Medication list has been reviewed and updated.  Current Meds  Medication Sig   acetaminophen  (TYLENOL ) 325 MG tablet Take by mouth.   Cholecalciferol  1.25 MG (50000 UT) capsule Take 1 capsule (50,000 Units total) by mouth once a week for 12 doses.   fluticasone (FLONASE) 50 MCG/ACT nasal spray Place into the nose.   spironolactone (ALDACTONE) 25 MG tablet Take 1 tablet (25 mg total) by mouth daily.   [DISCONTINUED] cetirizine (ZYRTEC) 10 MG tablet Take 1 tablet by mouth daily.   [DISCONTINUED] olmesartan  (BENICAR ) 5 MG tablet Take 1 tablet (5 mg total) by mouth daily.   [DISCONTINUED] venlafaxine  XR (EFFEXOR  XR) 75 MG 24 hr capsule Take 1 capsule (75 mg total) by mouth daily with breakfast.     Review of Systems  Patient Active Problem List   Diagnosis Date Noted   Intolerance to numerous medications 02/20/2024   Eustachian tube dysfunction, bilateral 02/20/2024   Vitamin D  deficiency  02/02/2024   Shift work sleep disorder 02/02/2024   Deviated nasal septum 02/02/2024   Suspected sleep apnea 02/02/2024   Menopausal vasomotor syndrome 02/02/2024   GAD (generalized anxiety disorder) 02/02/2024   Prediabetes 02/02/2024   Temporomandibular joint disorder 11/22/2018   Migraine without aura 04/25/2018   History of endometrial biopsy 04/24/2018   Hx of abnormal cervical Pap smear 12/06/2017   Morbid obesity with BMI of 45.0-49.9, adult (HCC) 04/06/2016   Essential hypertension 07/25/2013    Allergies  Allergen Reactions   Naproxen Sodium Other (See Comments)    Other Reaction: Other reaction   Naproxen Palpitations and Other (See Comments)    Other Reaction: DIZZINESS INCREASE H/R   Amlodipine Other (See Comments)   Cat Dander Other (See Comments)    Allergy symptoms   Dog Epithelium (Canis Lupus Familiaris) Other (See Comments)    Allergy symptoms   Hydralazine Hcl Other (See Comments)    Polyuria   Hydrochlorothiazide Other (See Comments)    dehydration   Lidocaine      Burning, I felt like it was one fire   Lisinopril Nausea Only   Venlafaxine  Other (See Comments)    Dry mouth   Latex Rash    Immunization History  Administered Date(s) Administered   Hep B, Unspecified 11/03/2011, 11/22/2011, 01/16/2012, 06/12/2012, 07/30/2012   Influenza, Seasonal, Injecte, Preservative Fre 05/29/2023   Influenza,inj,Quad PF,6+ Mos 01/12/2021   Influenza,trivalent, recombinat, inj, PF 02/02/2024   Influenza-Unspecified 01/06/2022   MMR  04/09/1999, 11/03/2011   PPD Test 06/17/2021, 06/14/2022   Tdap 11/04/2011, 01/06/2022   Unspecified SARS-COV-2 Vaccination 10/23/2019, 11/13/2019, 06/17/2020   Varicella 11/03/2011    History reviewed. No pertinent surgical history.  Social History   Tobacco Use   Smoking status: Never    Passive exposure: Never   Smokeless tobacco: Never  Vaping Use   Vaping status: Never Used  Substance Use Topics   Alcohol use: Not  Currently   Drug use: Not Currently    History reviewed. No pertinent family history.      02/20/2024    9:50 AM 02/02/2024   10:57 AM 01/15/2024   10:19 AM 11/06/2023   11:46 AM  GAD 7 : Generalized Anxiety Score  Nervous, Anxious, on Edge 1 1 1  0  Control/stop worrying 2 1 1  0  Worry too much - different things 1 1 1  0  Trouble relaxing 0 1 0 0  Restless 0 0 0 0  Easily annoyed or irritable 0 1 0 0  Afraid - awful might happen 0 0 0 0  Total GAD 7 Score 4 5 3  0  Anxiety Difficulty Not difficult at all Somewhat difficult Not difficult at all Not difficult at all       02/20/2024    9:49 AM 02/02/2024   10:50 AM 01/15/2024   10:20 AM  Depression screen PHQ 2/9  Decreased Interest 1 1 0  Down, Depressed, Hopeless 1 1 0  PHQ - 2 Score 2 2 0  Altered sleeping 0 3 0  Tired, decreased energy 1 3 0  Change in appetite 1 3 0  Feeling bad or failure about yourself  0 0 0  Trouble concentrating 0 0 0  Moving slowly or fidgety/restless 0 0 0  Suicidal thoughts 0 0 0  PHQ-9 Score 4 11 0  Difficult doing work/chores Not difficult at all Somewhat difficult Not difficult at all    BP Readings from Last 3 Encounters:  02/20/24 (!) 152/102  02/02/24 (!) 154/106  01/15/24 (!) 151/99    Wt Readings from Last 3 Encounters:  02/20/24 (!) 325 lb 4 oz (147.5 kg)  02/02/24 (!) 326 lb (147.9 kg)  01/15/24 (!) 331 lb (150.1 kg)    BP (!) 152/102 (Cuff Size: Large) Comment (Cuff Size): XL  Pulse (!) 110   Ht 5' 11 (1.803 m)   Wt (!) 325 lb 4 oz (147.5 kg)   SpO2 96%   BMI 45.36 kg/m   Physical Exam Vitals and nursing note reviewed.  Constitutional:      Appearance: Normal appearance. She is obese.  Cardiovascular:     Rate and Rhythm: Normal rate.  Pulmonary:     Effort: Pulmonary effort is normal.  Abdominal:     General: There is no distension.  Musculoskeletal:        General: Normal range of motion.  Skin:    General: Skin is warm and dry.  Neurological:     Mental  Status: She is alert and oriented to person, place, and time.     Gait: Gait is intact.  Psychiatric:        Mood and Affect: Affect normal. Mood is anxious.     Recent Labs     Component Value Date/Time   NA 137 01/15/2024 1053   K 4.6 01/15/2024 1053   CL 101 01/15/2024 1053   CO2 24 01/15/2024 1053   GLUCOSE 92 01/15/2024 1053   GLUCOSE 99 03/24/2023 0907  BUN 15 01/15/2024 1053   CREATININE 0.86 01/15/2024 1053   CALCIUM 9.3 01/15/2024 1053   PROT 7.7 01/15/2024 1053   ALBUMIN 3.8 (L) 01/15/2024 1053   AST 11 01/15/2024 1053   ALT 13 01/15/2024 1053   ALKPHOS 135 (H) 01/15/2024 1053   BILITOT 0.3 01/15/2024 1053   GFRNONAA >60 03/24/2023 0907    Lab Results  Component Value Date   WBC 3.6 01/15/2024   HGB 12.5 01/15/2024   HCT 40.5 01/15/2024   MCV 83 01/15/2024   PLT 313 01/15/2024   Lab Results  Component Value Date   HGBA1C 5.8 (H) 01/15/2024   Lab Results  Component Value Date   CHOL 134 01/15/2024   HDL 52 01/15/2024   LDLCALC 72 01/15/2024   TRIG 42 01/15/2024   CHOLHDL 2.6 01/15/2024   Lab Results  Component Value Date   TSH 0.935 01/15/2024      Assessment and Plan:  Essential hypertension Assessment & Plan: Discussed with patient that her blood pressure remains inadequately controlled in clinic and at home. She does not want to increase olmesartan  dose. Explained that we have to do something. Will try spironolactone for her, and this might even have some benefit for hot flashes. Continue home BP monitoring.   Orders: -     Olmesartan  Medoxomil; Take 2 tablets (10 mg total) by mouth daily.  Dispense: 90 tablet; Refill: 1 -     Spironolactone; Take 1 tablet (25 mg total) by mouth daily.  Dispense: 30 tablet; Refill: 1  GAD (generalized anxiety disorder) Assessment & Plan: Continue venlafaxine  which she states has helped some with her anxiety and palpitations  Orders: -     Venlafaxine  HCl ER; Take 1 capsule (37.5 mg total) by mouth  daily with breakfast.  Dispense: 90 capsule; Refill: 1  Menopausal vasomotor syndrome Assessment & Plan: Reduce venlafaxine  dose back to 37.5 mg and refer to GYN.   Orders: -     Ambulatory referral to Gynecology -     Venlafaxine  HCl ER; Take 1 capsule (37.5 mg total) by mouth daily with breakfast.  Dispense: 90 capsule; Refill: 1  Eustachian tube dysfunction, bilateral Assessment & Plan: Referring to ENT. Consider Singulair but do not want to make too many changes at once.   Orders: -     Ambulatory referral to ENT     Return in about 2 weeks (around 03/05/2024) for OV f/u chronic conditions.    Rolan Hoyle, PA-C, DMSc, Nutritionist [facility] [phone]

## 2024-02-20 NOTE — Assessment & Plan Note (Signed)
 Reduce venlafaxine  dose back to 37.5 mg and refer to GYN.

## 2024-03-05 ENCOUNTER — Ambulatory Visit: Admitting: Physician Assistant

## 2024-03-08 ENCOUNTER — Ambulatory Visit
Admission: EM | Admit: 2024-03-08 | Discharge: 2024-03-08 | Disposition: A | Discharge status: Home / Self Care | Attending: Emergency Medicine | Admitting: Emergency Medicine

## 2024-03-08 ENCOUNTER — Encounter: Payer: Self-pay | Admitting: Emergency Medicine

## 2024-03-08 DIAGNOSIS — M62838 Other muscle spasm: Secondary | ICD-10-CM

## 2024-03-08 DIAGNOSIS — M542 Cervicalgia: Secondary | ICD-10-CM

## 2024-03-08 MED ORDER — METHYLPREDNISOLONE 4 MG PO TBPK
ORAL_TABLET | ORAL | 0 refills | Status: AC
Start: 2024-03-08 — End: ?

## 2024-03-08 MED ORDER — BACLOFEN 10 MG PO TABS: 10.0000 mg | ORAL_TABLET | Freq: Three times a day (TID) | ORAL | 0 refills | Status: AC

## 2024-03-08 MED ORDER — DEXAMETHASONE SOD PHOSPHATE PF 10 MG/ML IJ SOLN
10.0000 mg | Freq: Once | INTRAMUSCULAR | Status: AC
  Administered 2024-03-08: 10 mg via INTRAMUSCULAR

## 2024-03-08 NOTE — ED Provider Notes (Signed)
 [facility]    CSN: [id number] Arrival date & time: 03/08/24  1221      History   Chief Complaint Chief Complaint  Patient presents with   Neck Pain    HPI Suzanne Wallace is a 48 y.o. female.   HPI  48 year old female with past medical history significant for hypertension, obesity, migraine headaches without aura, TMJ, GAD, menopausal vasomotor syndrome, and prediabetes presents for evaluation of lower cervical and upper thoracic back pain that has been going on for the last 3 days.  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  She does not member anything in particular that contributed to the pain.  She has been taking Tylenol  without improvement of symptoms.  She cannot take NSAIDs due to the fact that it caused palpitations.  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 No pertinent surgical history.  OB History   No obstetric history on file.      Home Medications    Prior to Admission medications   Medication Sig Start Date End Date Taking? Authorizing Provider  acetaminophen  (TYLENOL ) 325 MG tablet Take by mouth. 12/12/17  Yes [provider]  baclofen  (LIORESAL ) 10 MG tablet Take 1 tablet (10 mg  total) by mouth 3 (three) times daily. 03/08/24  Yes Bernardino Ditch, NP  Cholecalciferol  1.25 MG (50000 UT) capsule Take 1 capsule (50,000 Units total) by mouth once a week for 12 doses. 02/02/24 04/20/24 Yes Waddell, Toribio SQUIBB, PA  fluticasone (FLONASE) 50 MCG/ACT nasal spray Place into the nose. 02/06/19  Yes [provider]  methylPREDNISolone (MEDROL DOSEPAK) 4 MG TBPK tablet Take according to the package insert. 03/08/24  Yes Bernardino Ditch, NP  olmesartan  (BENICAR ) 5 MG tablet Take 2 tablets (10 mg total) by mouth daily. 02/20/24  Yes Manya Toribio SQUIBB, PA  spironolactone (ALDACTONE) 25 MG tablet Take 1 tablet (25 mg total) by mouth daily. 02/20/24  Yes Manya Toribio SQUIBB, PA  venlafaxine  XR (EFFEXOR  XR) 37.5 MG 24 hr capsule Take 1 capsule (37.5 mg total) by mouth daily with breakfast. 02/20/24  Yes Manya Toribio SQUIBB, PA    Family History History reviewed. No pertinent family history.  Social History Social History   Tobacco Use   Smoking status: Never    Passive exposure: Never   Smokeless tobacco: Never  Vaping Use   Vaping status: Never Used  Substance Use Topics   Alcohol use: Not Currently   Drug use: Not Currently     Allergies   Naproxen sodium, Naproxen, Amlodipine, Cat dander, Dog epithelium (canis lupus familiaris), Hydralazine hcl, Hydrochlorothiazide, Lidocaine , Lisinopril, Venlafaxine , and Latex   Review of Systems Review of Systems  Musculoskeletal:  Positive for back pain.  Skin:  Positive for rash.  Neurological:  Negative for weakness and numbness.     Physical Exam Triage Vital Signs ED Triage Vitals  Encounter Vitals Group     BP 03/08/24 1240 (!) 144/101     Girls Systolic BP Percentile --      Girls Diastolic BP Percentile --      Boys Systolic BP Percentile --      Boys Diastolic BP Percentile --      Pulse Rate 03/08/24 1240 (!) 113     Resp 03/08/24 1240 16     Temp 03/08/24 1240 98.3 F (36.8 C)     Temp Source 03/08/24 1240 Oral      SpO2 03/08/24 1240 98 %     Weight 03/08/24 1238 (!) 325 lb 2.9 oz (147.5 kg)     Height 03/08/24 1238 5' 11 (1.803 m)     Head Circumference --      Peak Flow --      Pain Score 03/08/24 1238 8     Pain Loc --      Pain Education --      Exclude from Growth Chart --    No data found.  Updated Vital Signs BP (!) 144/101 (BP Location: Right Arm)   Pulse (!) 113   Temp 98.3 F (36.8 C) (Oral)   Resp 16   Ht 5' 11 (1.803 m)   Wt (!) 325 lb 2.9 oz (147.5 kg)   SpO2 98%   BMI 45.35 kg/m   Visual Acuity Right Eye Distance:   Left Eye Distance:   Bilateral Distance:    Right Eye Near:   Left Eye Near:    Bilateral Near:     Physical Exam Vitals and nursing note reviewed.  Constitutional:      Appearance: Normal appearance. She is not ill-appearing.  HENT:     Head: Normocephalic and atraumatic.  Musculoskeletal:        General: Tenderness present. No signs of injury.  Skin:    General: Skin is warm and dry.     Capillary Refill: Capillary refill takes less than 2 seconds.  Neurological:     General: No focal deficit present.     Mental Status: She is alert and oriented to person, place, and time.      UC Treatments / Results  Labs (all labs ordered are listed, but only abnormal results are displayed) Labs Reviewed - No data to display  EKG   Radiology No results found.  Procedures Procedures (including critical care time)  Medications Ordered in UC Medications - No data to display  Initial Impression / Assessment and Plan / UC Course  I have reviewed the triage vital signs and the nursing notes.  Pertinent labs & imaging results that were available during my care of the patient were reviewed by me and considered in my medical decision making (see chart for details).   Patient is a pleasant, nontoxic-appearing 48 year old female presenting for evaluation of lower cervical and upper thoracic back pain as outlined in HPI above.  She denies any  numbness or weakness in her upper extremities and her bilateral grips and upper extremity strength are 5/5.  She has no midline spinous process tenderness or step-off in her cervical or upper thoracic spine.  She does have muscle tension and tenderness to palpation in the lower cervical and upper thoracic paraspinous regions bilaterally.  I suspect this is most likely musculoskeletal in nature.  NSAIDs caused her to have palpitations.  She does have an  elevated blood pressure in clinic of 144/101 and elevated heart rate of 113.  She reports that she has whitecoat syndrome.  She has taken her olmesartan  and spironolactone for her blood pressure.  I will have staff repeat a manual blood pressure prior to discharge.  I will also have administered 10 mg of IM Decadron here in clinic and discharge her home on a Medrol Dosepak along with 10 mg of baclofen  that she can take 3 times daily.  Additionally, I will give home physical therapy exercises.  Return precautions reviewed.  Work note provided.   Final Clinical Impressions(s) / UC Diagnoses   Final diagnoses:  Neck pain  Trapezius muscle spasm     Discharge Instructions      Take the Medrol Dosepak according to the package instructions for your neck and upper back pain.  Start this tomorrow morning.  Take the baclofen , 10 mg every 8 hours, on a schedule for the next 48 hours and then as needed.  Apply moist heat to your neck /back for 30 minutes at a time 2-3 times a day to improve blood flow to the area and help remove the lactic acid causing the spasm.  Follow the neck /back exercises given at discharge.  Return for reevaluation for any new or worsening symptoms.      ED Prescriptions     Medication Sig Dispense Auth. Provider   methylPREDNISolone (MEDROL DOSEPAK) 4 MG TBPK tablet Take according to the package insert. 1 each Bernardino Ditch, NP   baclofen  (LIORESAL ) 10 MG tablet Take 1 tablet (10 mg total) by mouth 3 (three) times daily. 30  each Bernardino Ditch, NP      PDMP not reviewed this encounter.   Bernardino Ditch, NP 03/08/24 1251

## 2024-03-08 NOTE — ED Triage Notes (Signed)
 Pt c/o neck pain that radiates into her upper back. Started about 3 days ago. She states it is worse when she sits up for a longer period of time. 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

## 2024-03-08 NOTE — Discharge Instructions (Addendum)
 Take the Medrol Dosepak according to the package instructions for your neck and upper back pain.  Start this tomorrow morning.  Take the baclofen , 10 mg every 8 hours, on a schedule for the next 48 hours and then as needed.  Apply moist heat to your neck /back for 30 minutes at a time 2-3 times a day to improve blood flow to the area and help remove the lactic acid causing the spasm.  Follow the neck /back exercises given at discharge.  Return for reevaluation for any new or worsening symptoms.

## 2024-03-11 ENCOUNTER — Ambulatory Visit: Admitting: Physician Assistant

## 2024-03-13 ENCOUNTER — Ambulatory Visit: Payer: Self-pay

## 2024-03-13 NOTE — Telephone Encounter (Signed)
 FYI Only or Action Required?: Action required by provider: clinical question for provider and update on patient condition.  Patient was last seen in primary care on 02/20/2024 by Manya Toribio SQUIBB, PA.  Called Nurse Triage reporting Medication Problem.   Triage Disposition: Information or Advice Only Call  Patient/caregiver understands and will follow disposition?: Yes     Copied from CRM [id number]. Topic: Clinical - Red Word Triage >> Mar 13, 2024  1:26 PM Ivette P wrote: [facility] that prompted transfer to Nurse Triage: Pt states she is feeling sick, went to get a drug screen and a drug came out that she does not take said something like Adderall pt does nknow if pharmacy mixed up her medication.      Reason for Disposition  Caller has medicine question, adult has minor symptoms, caller declines triage, AND triager answers question  Answer Assessment - Initial Assessment Questions Patient states that her Medrol Dosepak from urgent care is causing her loss of appetite and fatigue, and she believes it may be too strong and that she is going to stop taking it. Patient also was questioning if her Venlafaxine  XR could cause a false positive for amphetamine in a drug test. I advised that the Venlafaxine  XR could cause a false positive for amphetamine. She verbalized understanding and had no further questions or needs at this time.     1. NAME of MEDICINE: What medicine(s) are you calling about?     Medrol Dosepak and venlafaxine  XR 2. QUESTION: What is your question? (e.g., double dose of medicine, side effect)     Side effects of Medrol Dosepak and if Venlafaxine  XR can cause a false positive drug test.  3. PRESCRIBER: Who prescribed the medicine? Reason: if prescribed by specialist, call should be referred to that group.     Toribio Manya, PA 4. SYMPTOMS: Do you have any symptoms? If Yes, ask: What symptoms are you having?  How bad are the symptoms (e.g., mild, moderate,  severe)     Fatigue and loss of appetite since starting Medrol Dosepak  Protocols used: Medication Question Call-A-AH

## 2024-03-13 NOTE — Telephone Encounter (Signed)
 Please review and send rx if appropriate.

## 2024-03-14 NOTE — Telephone Encounter (Signed)
 LMOM informing patient that pharmacy did not mix up medication and to continue to take Venlafaxine  as prescribed. If she had any question to call the office back at [phone].  JM

## 2024-03-17 ENCOUNTER — Other Ambulatory Visit: Payer: Self-pay | Admitting: Physician Assistant

## 2024-03-17 DIAGNOSIS — F411 Generalized anxiety disorder: Secondary | ICD-10-CM

## 2024-03-17 DIAGNOSIS — N951 Menopausal and female climacteric states: Secondary | ICD-10-CM

## 2024-03-19 NOTE — Telephone Encounter (Signed)
 Dosage changed. Requested Prescriptions  Refused Prescriptions Disp Refills   venlafaxine  XR (EFFEXOR -XR) 75 MG 24 hr capsule [Pharmacy Med Name: VENLAFAXINE  ER 75MG  CAPSULES] 30 capsule 0    Sig: TAKE 1 CAPSULE(75 MG) BY MOUTH DAILY WITH BREAKFAST     Psychiatry: Antidepressants - SNRI - desvenlafaxine & venlafaxine  Failed - 03/19/2024 12:14 PM      Failed - Last BP in normal range    BP Readings from Last 1 Encounters:  03/08/24 (!) 148/102         Failed - Lipid Panel in normal range within the last 12 months    Cholesterol, Total  Date Value Ref Range Status  01/15/2024 134 100 - 199 mg/dL Final   LDL Chol Calc (NIH)  Date Value Ref Range Status  01/15/2024 72 0 - 99 mg/dL Final   HDL  Date Value Ref Range Status  01/15/2024 52 >39 mg/dL Final   Triglycerides  Date Value Ref Range Status  01/15/2024 42 0 - 149 mg/dL Final         Passed - Cr in normal range and within 360 days    Creatinine, Ser  Date Value Ref Range Status  01/15/2024 0.86 0.57 - 1.00 mg/dL Final         Passed - Valid encounter within last 6 months    Recent Outpatient Visits           4 weeks ago Essential hypertension   [place] [facility] at [facility], Toribio SQUIBB, [place]   1 month ago Lower abdominal pain   [facility] at [facility], Toribio SQUIBB, [place]

## 2024-03-25 ENCOUNTER — Ambulatory Visit: Admitting: Physician Assistant

## 2024-04-01 ENCOUNTER — Ambulatory Visit: Admitting: Physician Assistant

## 2024-04-10 ENCOUNTER — Ambulatory Visit: Admitting: Physician Assistant

## 2024-04-18 ENCOUNTER — Ambulatory Visit: Admitting: Physician Assistant

## 2024-04-24 ENCOUNTER — Telehealth: Payer: Self-pay

## 2024-04-24 ENCOUNTER — Other Ambulatory Visit: Payer: Self-pay | Admitting: Physician Assistant

## 2024-04-24 DIAGNOSIS — I10 Essential (primary) hypertension: Secondary | ICD-10-CM

## 2024-04-24 MED ORDER — SPIRONOLACTONE 25 MG PO TABS: 25.0000 mg | ORAL_TABLET | Freq: Every day | ORAL | 0 refills | Status: AC

## 2024-04-24 NOTE — Telephone Encounter (Signed)
 Copied from CRM #[id number]. Topic: Clinical - Medical Advice >> Apr 24, 2024  1:14 PM Winona R wrote: Pt would like to know if she can receive a med refill if she reschedule her appointment for 12/18 Pt states she can not afford the copay at the moment and is also in the middle of moving. It its not possible to get to this appointment tomorrow but she really need the htn medication. Please contact the pt by the end of the day to offer some feedback

## 2024-04-24 NOTE — Telephone Encounter (Signed)
 Spoke with patient via phone, refilled spironolactone .  She already rescheduled her appointment for January 6.

## 2024-04-24 NOTE — Telephone Encounter (Signed)
 Called at 3:30p and 4:30p for more details. LVM. Will send MyChart Message too

## 2024-04-25 ENCOUNTER — Ambulatory Visit: Admitting: Physician Assistant

## 2024-05-14 ENCOUNTER — Encounter: Payer: Self-pay | Admitting: Physician Assistant

## 2024-05-14 ENCOUNTER — Ambulatory Visit: Admitting: Physician Assistant

## 2024-06-05 ENCOUNTER — Other Ambulatory Visit: Payer: Self-pay | Admitting: Physician Assistant

## 2024-06-05 DIAGNOSIS — I10 Essential (primary) hypertension: Secondary | ICD-10-CM

## 2024-06-06 ENCOUNTER — Telehealth: Payer: Self-pay | Admitting: Physician Assistant

## 2024-06-06 ENCOUNTER — Other Ambulatory Visit: Payer: Self-pay

## 2024-06-06 DIAGNOSIS — I10 Essential (primary) hypertension: Secondary | ICD-10-CM

## 2024-06-06 MED ORDER — OLMESARTAN MEDOXOMIL 5 MG PO TABS: 10.0000 mg | ORAL_TABLET | Freq: Every day | ORAL | 0 refills | Status: AC

## 2024-06-06 NOTE — Telephone Encounter (Signed)
 Copied from CRM [id number]. Topic: Clinical - Prescription Issue >> Jun 06, 2024  2:07 PM Montie POUR wrote: Reason for CRM:  Suzanne Wallace is calling because she only has 6 tablets left and pharmacy is telling her there are no refills for olmesartan  (BENICAR ) 5 MG tablet. There is a note in her chart that states it is to soon for refill. Please call her at [phone] and you can leave her a detailed message.

## 2024-06-06 NOTE — Telephone Encounter (Signed)
 Too soon for refill.  Requested Prescriptions  Pending Prescriptions Disp Refills   olmesartan  (BENICAR ) 5 MG tablet [Pharmacy Med Name: OLMESARTAN  MEDOXOMIL 5MG  TABLETS] 90 tablet 1    Sig: TAKE 2 TABLETS(10 MG) BY MOUTH DAILY     Cardiovascular:  Angiotensin Receptor Blockers Failed - 06/06/2024  1:41 PM      Failed - Last BP in normal range    BP Readings from Last 1 Encounters:  03/08/24 (!) 148/102         Passed - Cr in normal range and within 180 days    Creatinine, Ser  Date Value Ref Range Status  01/15/2024 0.86 0.57 - 1.00 mg/dL Final         Passed - K in normal range and within 180 days    Potassium  Date Value Ref Range Status  01/15/2024 4.6 3.5 - 5.2 mmol/L Final         Passed - Patient is not pregnant      Passed - Valid encounter within last 6 months    Recent Outpatient Visits           3 months ago Essential hypertension   [place] [facility] at [facility], Toribio SQUIBB, [place]   4 months ago Lower abdominal pain   [facility] at [facility], Toribio SQUIBB, [place]

## 2024-06-06 NOTE — Telephone Encounter (Signed)
 Called pt she was not pleasant. She was demanding that medication be sent in. I told pt she has missed over 7 appointments since Oct 2025 that she needed to be seen. Pt was upset that I told her that she needed to keep her next appointment on 2/3. Pt stated I did not need you to tell me to keep my next appointment I am the one who scheduled the appointment. Pt stated that she didn't need me or Dan to tell her that she needed to be at her next appointment. Told pt I would send in enough medication to last until her appt (15 tablets). Asked pt was there anything else that I could do for her. Pt was talking very loudly that everyone at the nurses station could here the pt. Pt is aware that she needs to come to her appt on 06/11/24.  KP

## 2024-06-11 ENCOUNTER — Ambulatory Visit: Admitting: Physician Assistant
